# Patient Record
Sex: Female | Born: 2000 | Race: White | Hispanic: No | Marital: Married
Health system: Southern US, Community
[De-identification: ages and names within clinical notes are randomized; demographics above are authoritative.]

## PROBLEM LIST (undated history)

## (undated) DIAGNOSIS — B019 Varicella without complication: Secondary | ICD-10-CM

## (undated) DIAGNOSIS — K219 Gastro-esophageal reflux disease without esophagitis: Secondary | ICD-10-CM

## (undated) DIAGNOSIS — R109 Unspecified abdominal pain: Secondary | ICD-10-CM

## (undated) DIAGNOSIS — R51 Headache: Secondary | ICD-10-CM

## (undated) DIAGNOSIS — R519 Headache, unspecified: Secondary | ICD-10-CM

## (undated) DIAGNOSIS — K529 Noninfective gastroenteritis and colitis, unspecified: Secondary | ICD-10-CM

## (undated) HISTORY — PX: WISDOM TOOTH EXTRACTION: SHX21

---
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
## 2019-01-19 ENCOUNTER — Other Ambulatory Visit: Payer: Self-pay | Admitting: Family Medicine

## 2019-01-19 DIAGNOSIS — Z3041 Encounter for surveillance of contraceptive pills: Secondary | ICD-10-CM

## 2019-03-15 ENCOUNTER — Ambulatory Visit: Payer: BLUE CROSS/BLUE SHIELD

## 2019-04-16 ENCOUNTER — Other Ambulatory Visit: Payer: Self-pay | Admitting: Family Medicine

## 2019-04-16 ENCOUNTER — Other Ambulatory Visit: Payer: Self-pay

## 2019-04-16 ENCOUNTER — Encounter: Payer: Self-pay | Admitting: Family Medicine

## 2019-04-16 ENCOUNTER — Ambulatory Visit (INDEPENDENT_AMBULATORY_CARE_PROVIDER_SITE_OTHER): Payer: BLUE CROSS/BLUE SHIELD | Admitting: Family Medicine

## 2019-04-16 VITALS — BP 130/78 | HR 105 | Temp 99.4°F | Ht 65.0 in | Wt 160.0 lb

## 2019-04-16 DIAGNOSIS — Z3041 Encounter for surveillance of contraceptive pills: Secondary | ICD-10-CM

## 2019-04-16 DIAGNOSIS — Z23 Encounter for immunization: Secondary | ICD-10-CM

## 2019-04-16 MED ORDER — BLISOVI 24 FE 1-20 MG-MCG(24) PO TABS: 1.0000 | ORAL_TABLET | Freq: Every day | ORAL | 4 refills | Status: DC

## 2019-04-16 NOTE — Addendum Note (Signed)
Addended byCarrolyn Leigh on: 04/16/2019 03:09 PM   Modules accepted: Orders

## 2019-04-16 NOTE — Progress Notes (Signed)
Subjective: CC: Contraception PCP: Raliegh Ip, DO Patricia Hurley is a 18 y.o. female presenting to clinic today for:  1. Contraceptive counseling Patient presents today for follow up on contraception.  She is a G0P0.  She reports her menstrual cycles are regular and light.  She is compliant with OCP. Previous methods of birth control tried: OCPs.  She is sexually active with 1 partner.  She denies h/o STIs.  She denies heavy menstrual bleeding, excessive cramping, abdominal masses. No abnormal vaginal discharge, lesions.  She denies personal or family history of: liver disease, breast cancer, clotting disorder (including DVT/PE), migraine headaches. She is a non smoker. Patient's last menstrual period was 03/28/2019.  Last pap: n/a Last unprotected sex: never.     ROS: Per HPI  No Known Allergies Past Medical History:  Diagnosis Date  . Abdominal pain   . Chronic diarrhea   . GERD (gastroesophageal reflux disease)    with chronic belching  . Headache   . Varicella    PMH: x 2    Current Outpatient Medications:  .  amitriptyline (ELAVIL) 10 MG tablet, Take 20 mg by mouth at bedtime. , Disp: , Rfl: 5 .  Norethindrone Acetate-Ethinyl Estrad-FE (BLISOVI 24 FE) 1-20 MG-MCG(24) tablet, Take 1 tablet by mouth daily. (PLease make you yearly Nov appt), Disp: 84 tablet, Rfl: 0 .  ondansetron (ZOFRAN ODT) 4 MG disintegrating tablet, Take 1 tablet (4 mg total) by mouth every 8 (eight) hours as needed for nausea or vomiting., Disp: 12 tablet, Rfl: 0 .  pantoprazole (PROTONIX) 40 MG tablet, Take 40 mg by mouth daily., Disp: , Rfl: 2 Social History   Socioeconomic History  . Marital status: Single    Spouse name: Not on file  . Number of children: Not on file  . Years of education: Not on file  . Highest education level: Not on file  Occupational History  . Not on file  Tobacco Use  . Smoking status: Passive Smoke Exposure - Never Smoker  . Smokeless tobacco: Never Used    . Tobacco comment: father smokes in the home  Substance and Sexual Activity  . Alcohol use: Yes    Comment: social  . Drug use: No    Comment: ask pt in private  . Sexual activity: Not on file    Comment: ask pt in private; pt on birth control  Other Topics Concern  . Not on file  Social History Narrative   Pt in 11th grade 2018-19 and does excellent in school. Lives at home with mother and brother with alternating weeks with father.   Social Determinants of Health   Financial Resource Strain:   . Difficulty of Paying Living Expenses: Not on file  Food Insecurity:   . Worried About Programme researcher, broadcasting/film/video in the Last Year: Not on file  . Ran Out of Food in the Last Year: Not on file  Transportation Needs:   . Lack of Transportation (Medical): Not on file  . Lack of Transportation (Non-Medical): Not on file  Physical Activity:   . Days of Exercise per Week: Not on file  . Minutes of Exercise per Session: Not on file  Stress:   . Feeling of Stress : Not on file  Social Connections:   . Frequency of Communication with Friends and Family: Not on file  . Frequency of Social Gatherings with Friends and Family: Not on file  . Attends Religious Services: Not on file  . Active Member  of Clubs or Organizations: Not on file  . Attends Archivist Meetings: Not on file  . Marital Status: Not on file  Intimate Partner Violence:   . Fear of Current or Ex-Partner: Not on file  . Emotionally Abused: Not on file  . Physically Abused: Not on file  . Sexually Abused: Not on file   Family History  Problem Relation Age of Onset  . Hypertension Mother   . Depression Father   . Scoliosis Sister   . ADD / ADHD Brother     Objective: Office vital signs reviewed. BP 130/78   Pulse (!) 105   Temp 99.4 F (37.4 C) (Temporal)   Ht 5\' 5"  (1.651 m)   Wt 160 lb (72.6 kg)   LMP 03/28/2019   BMI 26.63 kg/m   Physical Examination:  General: Awake, alert, well nourished, No acute  distress HEENT: Normal, sclera white, MMM Cardio: regular rate and rhythm, S1S2 heard, no murmurs appreciated Pulm: clear to auscultation bilaterally, no wheezes, rhonchi or rales; normal work of breathing on room air GI: soft, non-tender, non-distended, no hepatomegaly, no splenomegaly, no masses GU: no palpable adnexal or uterine masses  Assessment/ Plan: 18 y.o. female   1. Encounter for surveillance of contraceptive pills Stable.  Continue OCP.  Follow-up care or in 1 year. - Norethindrone Acetate-Ethinyl Estrad-FE (BLISOVI 24 FE) 1-20 MG-MCG(24) tablet; Take 1 tablet by mouth daily.  Dispense: 84 tablet; Refill: [facility], [place] [phone]

## 2019-05-10 ENCOUNTER — Ambulatory Visit (INDEPENDENT_AMBULATORY_CARE_PROVIDER_SITE_OTHER): Payer: BLUE CROSS/BLUE SHIELD | Admitting: *Deleted

## 2019-05-10 ENCOUNTER — Telehealth: Payer: Self-pay | Admitting: Family Medicine

## 2019-05-10 DIAGNOSIS — Z111 Encounter for screening for respiratory tuberculosis: Secondary | ICD-10-CM | POA: Diagnosis not present

## 2019-05-10 NOTE — Progress Notes (Signed)
PPd skin test placed on right forearm. Patient tolerated well. Patient was given instructions on when to return to have ppd skin test read.

## 2019-05-10 NOTE — Telephone Encounter (Signed)
Wants to speak with nurse regarding immunization records.

## 2019-05-10 NOTE — Telephone Encounter (Signed)
Appt made

## 2019-05-12 LAB — TB SKIN TEST
Induration: 0 mm
TB Skin Test: NEGATIVE

## 2020-06-03 ENCOUNTER — Other Ambulatory Visit: Payer: Self-pay | Admitting: Family Medicine

## 2020-06-03 DIAGNOSIS — Z3041 Encounter for surveillance of contraceptive pills: Secondary | ICD-10-CM

## 2020-06-06 ENCOUNTER — Other Ambulatory Visit: Payer: Self-pay | Admitting: Family Medicine

## 2020-06-06 DIAGNOSIS — Z3041 Encounter for surveillance of contraceptive pills: Secondary | ICD-10-CM

## 2020-06-13 ENCOUNTER — Other Ambulatory Visit: Payer: Self-pay | Admitting: Family Medicine

## 2020-06-13 DIAGNOSIS — Z3041 Encounter for surveillance of contraceptive pills: Secondary | ICD-10-CM

## 2020-07-19 ENCOUNTER — Ambulatory Visit: Payer: BLUE CROSS/BLUE SHIELD | Admitting: Family Medicine

## 2020-08-03 ENCOUNTER — Encounter: Payer: Self-pay | Admitting: Family Medicine

## 2020-08-03 ENCOUNTER — Other Ambulatory Visit: Payer: Self-pay

## 2020-08-03 ENCOUNTER — Ambulatory Visit: Payer: Managed Care, Other (non HMO) | Admitting: Family Medicine

## 2020-08-03 VITALS — BP 111/73 | HR 92 | Temp 97.4°F | Ht 65.0 in | Wt 164.0 lb

## 2020-08-03 DIAGNOSIS — Z1159 Encounter for screening for other viral diseases: Secondary | ICD-10-CM

## 2020-08-03 DIAGNOSIS — L819 Disorder of pigmentation, unspecified: Secondary | ICD-10-CM

## 2020-08-03 DIAGNOSIS — K582 Mixed irritable bowel syndrome: Secondary | ICD-10-CM

## 2020-08-03 DIAGNOSIS — Z130 Encounter for screening for diseases of the blood and blood-forming organs and certain disorders involving the immune mechanism: Secondary | ICD-10-CM

## 2020-08-03 DIAGNOSIS — Z3041 Encounter for surveillance of contraceptive pills: Secondary | ICD-10-CM

## 2020-08-03 DIAGNOSIS — Z111 Encounter for screening for respiratory tuberculosis: Secondary | ICD-10-CM | POA: Diagnosis not present

## 2020-08-03 DIAGNOSIS — Z114 Encounter for screening for human immunodeficiency virus [HIV]: Secondary | ICD-10-CM

## 2020-08-03 DIAGNOSIS — Z0001 Encounter for general adult medical examination with abnormal findings: Secondary | ICD-10-CM

## 2020-08-03 DIAGNOSIS — Z0000 Encounter for general adult medical examination without abnormal findings: Secondary | ICD-10-CM

## 2020-08-03 LAB — CBC WITH DIFFERENTIAL/PLATELET
Basophils Absolute: 0 10*3/uL (ref 0.0–0.2)
Basos: 1 %
EOS (ABSOLUTE): 0 10*3/uL (ref 0.0–0.4)
Eos: 1 %
Hematocrit: 41.2 % (ref 34.0–46.6)
Hemoglobin: 14 g/dL (ref 11.1–15.9)
Immature Grans (Abs): 0 10*3/uL (ref 0.0–0.1)
Immature Granulocytes: 0 %
Lymphocytes Absolute: 2.1 10*3/uL (ref 0.7–3.1)
Lymphs: 36 %
MCH: 31.6 pg (ref 26.6–33.0)
MCHC: 34 g/dL (ref 31.5–35.7)
MCV: 93 fL (ref 79–97)
Monocytes Absolute: 0.3 10*3/uL (ref 0.1–0.9)
Monocytes: 6 %
Neutrophils Absolute: 3.4 10*3/uL (ref 1.4–7.0)
Neutrophils: 56 %
Platelets: 385 10*3/uL (ref 150–450)
RBC: 4.43 x10E6/uL (ref 3.77–5.28)
RDW: 11.8 % (ref 11.7–15.4)
WBC: 6 10*3/uL (ref 3.4–10.8)

## 2020-08-03 MED ORDER — BLISOVI 24 FE 1-20 MG-MCG(24) PO TABS: 1.0000 | ORAL_TABLET | Freq: Every day | ORAL | 4 refills | Status: DC

## 2020-08-03 NOTE — Progress Notes (Signed)
Patricia Hurley is a 20 y.o. female presents to office today for annual physical exam examination.    Concerns today include: 1. School forms>[employer] for nursing. Plans for NICU/ OB/GYN.  Will need TB testing. No known exposures to HIV/ Hep c. No needles sticks at work. No barriers to performing tasks  2. IBS Stable with meds provided by GI. Flare ups less than 4 times per month  3. OCP Monogamous relationship.  Compliant with OCP. No vaginal symptoms. Menses are regular.  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 DTaP 06/05/2000, 07/31/2000, 10/13/2000, 06/26/2001  . HPV 9-valent 07/10/2015, 11/13/2015  . HPV Quadrivalent 04/28/2015  . Hepatitis B 2000/03/07, 04/25/2000, 10/13/2000  . HiB (PRP-OMP) 06/05/2000, 07/31/2000, 10/13/2000, 03/27/2001  . IPV 06/05/2000, 07/31/2000, 06/26/2001, 05/14/2005  . Influenza, Seasonal, Injecte, Preservative Fre 03/06/2020  . Influenza,inj,Quad PF,6+ Mos 05/26/2018  . Influenza-Unspecified 03/13/2019  . MMR 03/27/2001, 04/03/2006  . PFIZER(Purple Top)SARS-COV-2 Vaccination 01/18/2020, 02/10/2020  . PPD Test 05/26/2018, 10/20/2018, 05/10/2019  . Pneumococcal Conjugate-13 10/13/2000, 06/26/2001, 07/31/2001, 03/25/2002  . Tdap 12/22/2001, 04/16/2019  . Varicella 03/27/2001, 05/28/2018     Past Medical History:  Diagnosis Date  . Abdominal pain   . Chronic diarrhea   . GERD (gastroesophageal reflux disease)    with chronic belching  . Headache   . Varicella    PMH: x 2   Social History   Socioeconomic History  . Marital status: Single    Spouse name: Not on file  . Number of children: Not on file  . Years of education: Not on file  . Highest education level: Not on file  Occupational History  . Not on file   Tobacco Use  . Smoking status: Passive Smoke Exposure - Never Smoker  . Smokeless tobacco: Never Used  . Tobacco comment: father smokes in the home  Vaping Use  . Vaping Use: Never used  Substance and Sexual Activity  . Alcohol use: Yes    Comment: social  . Drug use: No    Comment: ask pt in private  . Sexual activity: Not on file    Comment: ask pt in private; pt on birth control  Other Topics Concern  . Not on file  Social History Narrative   Pt in 11th grade 2018-19 and does excellent in school. Lives at home with mother and brother with alternating weeks with father.   Social Determinants of Health   Financial Resource Strain: Not on file  Food Insecurity: Not on file  Transportation Needs: Not on file  Physical Activity: Not on file  Stress: Not on file  Social Connections: Not on file  Intimate Partner Violence: Not on file   Past Surgical History:  Procedure Laterality Date  . COLONOSCOPY WITH PROPOFOL N/A 12/17/2016   Procedure: COLONOSCOPY WITH PROPOFOL;  Surgeon: Joycelyn Rua, MD;  Location: [place];  Service: Gastroenterology;  Laterality: N/A;  . ESOPHAGOGASTRODUODENOSCOPY (EGD) WITH PROPOFOL N/A 12/17/2016   Procedure: ESOPHAGOGASTRODUODENOSCOPY (EGD) WITH PROPOFOL;  Surgeon: Joycelyn Rua, MD;  Location: [place];  Service: Gastroenterology;  Laterality: N/A;  . WISDOM TOOTH EXTRACTION     Family History  Problem Relation Age of Onset  . Hypertension Mother   . Depression Father   . Scoliosis Sister   . ADD / ADHD Brother  Current Outpatient Medications:  .  amitriptyline (ELAVIL) 10 MG tablet, Take 20 mg by mouth at bedtime. , Disp: , Rfl: 5 .  hyoscyamine (LEVSIN) 0.125 MG tablet, Take by mouth., Disp: , Rfl:  .  ondansetron (ZOFRAN ODT) 4 MG disintegrating tablet, Take 1 tablet (4 mg total) by mouth every 8 (eight) hours as needed for nausea or vomiting., Disp: 12 tablet, Rfl: 0 .  pantoprazole (PROTONIX) 40 MG tablet, Take 40 mg by mouth  daily., Disp: , Rfl: 2 .  Norethindrone Acetate-Ethinyl Estrad-FE (BLISOVI 24 FE) 1-20 MG-MCG(24) tablet, Take 1 tablet by mouth daily., Disp: 84 tablet, Rfl: 4  No Known Allergies   ROS: Review of Systems Pertinent items noted in HPI and remainder of comprehensive ROS otherwise negative.    Physical exam BP 111/73   Pulse 92   Temp (!) 97.4 F (36.3 C) (Temporal)   Ht 5' 5"  (1.651 m)   Wt 164 lb (74.4 kg)   LMP 07/20/2020   SpO2 97%   BMI 27.29 kg/m  General appearance: alert, cooperative, appears stated age and no distress Head: Normocephalic, without obvious abnormality, atraumatic Eyes: negative findings: lids and lashes normal, conjunctivae and sclerae normal, corneas clear and pupils equal, round, reactive to light and accomodation Ears: normal TM's and external ear canals both ears Nose: Nares normal. Septum midline. Mucosa normal. No drainage or sinus tenderness. Throat: lips, mucosa, and tongue normal; teeth and gums normal Neck: no adenopathy, supple, symmetrical, trachea midline and thyroid not enlarged, symmetric, no tenderness/mass/nodules Back: symmetric, no curvature. ROM normal. No CVA tenderness. Lungs: clear to auscultation bilaterally Heart: regular rate and rhythm, S1, S2 normal, no murmur, click, rub or gallop Abdomen: soft, non-tender; bowel sounds normal; no masses,  no organomegaly Extremities: extremities normal, atraumatic, no cyanosis or edema Pulses: 2+ and symmetric Skin: 1 mm pigmented nevi noted on right anterior shin, flat. nonbleeding Lymph nodes: Cervical, supraclavicular, and axillary nodes normal. Neurologic: Alert and oriented X 3, normal strength and tone. Normal symmetric reflexes. Normal coordination and gait Psych: mood stable Depression screen [facility] 2/9 08/03/2020 04/16/2019 03/13/2018  Decreased Interest 0 0 0  Down, Depressed, Hopeless 0 0 0  PHQ - 2 Score 0 0 0  Altered sleeping - 0 0  Tired, decreased energy - 0 0  Change in  appetite - 0 0  Feeling bad or failure about yourself  - 0 0  Trouble concentrating - 0 0  Moving slowly or fidgety/restless - 0 0  Suicidal thoughts - 0 0  PHQ-9 Score - 0 0  Difficult doing work/chores - - Not difficult at all     Assessment/ Plan: [name] here for annual physical exam.   Annual physical exam  Encounter for surveillance of contraceptive pills - Plan: CMP14+EGFR, Norethindrone Acetate-Ethinyl Estrad-FE (BLISOVI 24 FE) 1-20 MG-MCG(24) tablet, CMP14+EGFR  Screening for tuberculosis - Plan: QuantiFERON-TB Gold Plus, CMP14+EGFR, CMP14+EGFR  Encounter for hepatitis C screening test for low risk patient - Plan: Hepatitis C antibody, CMP14+EGFR, CMP14+EGFR  Screening for HIV without presence of risk factors - Plan: HIV antibody (with reflex), CMP14+EGFR, CMP14+EGFR  Screening, anemia, deficiency, iron - Plan: CBC with Differential  Pigmented skin lesion  Irritable bowel syndrome with both constipation and diarrhea  OCPs renewed.  Check CMP, CBC.  Last CBC showed elevated white blood cell count but this was sometime ago.  Overdue for first Pap smear after 03/26/2021  School form completed today.  Check quant gold, hepatitis C screen, HIV screen.  Pigmented skin lesion appears benign but since this is new I have recommend eval by dermatology.  She would like to watchfully wait.  She will contact me if things change.  IBS is stable and unchanged.  Not interfering with ability to complete work tasks or impeding quality of life.  Continue current medication regimen as prescribed by gastroenterology  Counseled on healthy lifestyle choices, including diet (rich in fruits, vegetables and lean meats and low in salt and simple carbohydrates) and exercise (at least 30 minutes of moderate physical activity daily).  Patient to follow up in 1 year for annual exam or sooner if needed.  Ethlyn Alto M. Lajuana Ripple, DO

## 2020-08-03 NOTE — Patient Instructions (Signed)

## 2020-08-04 LAB — CMP14+EGFR
ALT: 7 IU/L (ref 0–32)
AST: 10 IU/L (ref 0–40)
Albumin/Globulin Ratio: 1.7 (ref 1.2–2.2)
Albumin: 4.3 g/dL (ref 3.9–5.0)
Alkaline Phosphatase: 52 IU/L (ref 42–106)
BUN/Creatinine Ratio: 11 (ref 9–23)
BUN: 9 mg/dL (ref 6–20)
Bilirubin Total: 0.3 mg/dL (ref 0.0–1.2)
CO2: 21 mmol/L (ref 20–29)
Calcium: 9.7 mg/dL (ref 8.7–10.2)
Chloride: 103 mmol/L (ref 96–106)
Creatinine, Ser: 0.8 mg/dL (ref 0.57–1.00)
Globulin, Total: 2.6 g/dL (ref 1.5–4.5)
Glucose: 113 mg/dL — ABNORMAL HIGH (ref 65–99)
Potassium: 4.2 mmol/L (ref 3.5–5.2)
Sodium: 141 mmol/L (ref 134–144)
Total Protein: 6.9 g/dL (ref 6.0–8.5)
eGFR: 108 mL/min/{1.73_m2} (ref >59)

## 2020-08-04 LAB — QUANTIFERON-TB GOLD PLUS

## 2020-08-06 LAB — QUANTIFERON-TB GOLD PLUS
QuantiFERON Mitogen Value: >10 IU/mL
QuantiFERON TB1 Ag Value: 0.01 IU/mL
QuantiFERON TB2 Ag Value: 0.01 IU/mL
QuantiFERON-TB Gold Plus: NEGATIVE

## 2020-08-06 LAB — HIV ANTIBODY (ROUTINE TESTING W REFLEX): HIV Screen 4th Generation wRfx: NONREACTIVE

## 2020-08-06 LAB — HEPATITIS C ANTIBODY: Hep C Virus Ab: <0.1 s/co ratio (ref 0.0–0.9)

## 2021-10-08 ENCOUNTER — Other Ambulatory Visit: Payer: Self-pay | Admitting: Family Medicine

## 2021-10-08 DIAGNOSIS — Z3041 Encounter for surveillance of contraceptive pills: Secondary | ICD-10-CM

## 2021-10-25 ENCOUNTER — Encounter: Payer: Self-pay | Admitting: Family Medicine

## 2021-10-26 ENCOUNTER — Other Ambulatory Visit: Payer: Self-pay

## 2021-10-26 DIAGNOSIS — Z3041 Encounter for surveillance of contraceptive pills: Secondary | ICD-10-CM

## 2021-10-26 MED ORDER — BLISOVI 24 FE 1-20 MG-MCG(24) PO TABS: 1.0000 | ORAL_TABLET | Freq: Every day | ORAL | 0 refills | Status: DC

## 2021-12-04 ENCOUNTER — Encounter: Payer: Self-pay | Admitting: Family Medicine

## 2021-12-04 ENCOUNTER — Ambulatory Visit: Payer: Managed Care, Other (non HMO) | Admitting: Family Medicine

## 2021-12-04 VITALS — BP 142/94 | HR 111 | Temp 98.0°F | Ht 65.0 in | Wt 168.0 lb

## 2021-12-04 DIAGNOSIS — F411 Generalized anxiety disorder: Secondary | ICD-10-CM | POA: Diagnosis not present

## 2021-12-04 DIAGNOSIS — Z3041 Encounter for surveillance of contraceptive pills: Secondary | ICD-10-CM | POA: Diagnosis not present

## 2021-12-04 DIAGNOSIS — Z111 Encounter for screening for respiratory tuberculosis: Secondary | ICD-10-CM

## 2021-12-04 DIAGNOSIS — Z124 Encounter for screening for malignant neoplasm of cervix: Secondary | ICD-10-CM

## 2021-12-04 MED ORDER — BLISOVI 24 FE 1-20 MG-MCG(24) PO TABS: 1.0000 | ORAL_TABLET | Freq: Every day | ORAL | 4 refills | Status: DC

## 2021-12-04 MED ORDER — BUSPIRONE HCL 5 MG PO TABS: ORAL_TABLET | ORAL | 0 refills | Status: AC

## 2021-12-04 NOTE — Patient Instructions (Signed)
How to Take Your Blood Pressure Blood pressure measures how strongly your blood is pressing against the walls of your arteries. Arteries are blood vessels that carry blood from your heart throughout your body. You can take your blood pressure at home with a machine. You may need to check your blood pressure at home: To check if you have high blood pressure (hypertension). To check your blood pressure over time. To make sure your blood pressure medicine is working. Supplies needed: Blood pressure machine, or monitor. A chair to sit in. This should be a chair where you can sit upright with your back supported. Do not sit on a soft couch or an armchair. Table or desk. Small notebook. Pencil or pen. How to prepare Avoid these things for 30 minutes before checking your blood pressure: Having drinks with caffeine in them, such as coffee or tea. Drinking alcohol. Eating. Smoking. Exercising. Do these things five minutes before checking your blood pressure: Go to the bathroom and pee (urinate). Sit in a chair. Be quiet. Do not talk. How to take your blood pressure Follow the instructions that came with your machine. If you have a digital blood pressure monitor, these may be the instructions: Sit up straight. Place your feet on the floor. Do not cross your ankles or legs. Rest your left arm at the level of your heart. You may rest it on a table, desk, or chair. Pull up your shirt sleeve. Wrap the blood pressure cuff around the upper part of your left arm. The cuff should be 1 inch (2.5 cm) above your elbow. It is best to wrap the cuff around bare skin. Fit the cuff snugly around your arm, but not too tightly. You should be able to place only one finger between the cuff and your arm. Place the cord so that it rests in the bend of your elbow. Press the power button. Sit quietly while the cuff fills with air and loses air. Write down the numbers on the screen. Wait 2-3 minutes and then repeat  steps 1-10. What do the numbers mean? Two numbers make up your blood pressure. The first number is called systolic pressure. The second is called diastolic pressure. An example of a blood pressure reading is "120 over 80" (or 120/80). If you are an adult and do not have a medical condition, use this guide to find out if your blood pressure is normal: Normal First number: below 120. Second number: below 80. Elevated First number: 120-129. Second number: below 80. Hypertension stage 1 First number: 130-139. Second number: 80-89. Hypertension stage 2 First number: 140 or above. Second number: 90 or above. Your blood pressure is above normal even if only the first or only the second number is above normal. Follow these instructions at home: Medicines Take over-the-counter and prescription medicines only as told by your doctor. Tell your doctor if your medicine is causing side effects. General instructions Check your blood pressure as often as your doctor tells you to. Check your blood pressure at the same time every day. Take your monitor to your next doctor's appointment. Your doctor will: Make sure you are using it correctly. Make sure it is working right. Understand what your blood pressure numbers should be. Keep all follow-up visits. General tips You will need a blood pressure machine or monitor. Your doctor can suggest a monitor. You can buy one at a drugstore or online. When choosing one: Choose one with an arm cuff. Choose one that wraps around your   upper arm. Only one finger should fit between your arm and the cuff. Do not choose one that measures your blood pressure from your wrist or finger. Where to find more information American Heart Association: www.heart.org Contact a doctor if: Your blood pressure keeps being high. Your blood pressure is suddenly low. Get help right away if: Your first blood pressure number is higher than 180. Your second blood pressure number is  higher than 120. These symptoms may be an emergency. Do not wait to see if the symptoms will go away. Get help right away. Call 911. Summary Check your blood pressure at the same time every day. Avoid caffeine, alcohol, smoking, and exercise for 30 minutes before checking your blood pressure. Make sure you understand what your blood pressure numbers should be. This information is not intended to replace advice given to you by your health care provider. Make sure you discuss any questions you have with your health care provider. Document Revised: 01/04/2021 Document Reviewed: 01/04/2021 Elsevier Patient Education  2023 [employer]  
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
## 2021-12-05 ENCOUNTER — Telehealth: Payer: Self-pay | Admitting: Family Medicine

## 2021-12-05 NOTE — Telephone Encounter (Signed)
Pt schreduled

## 2021-12-06 LAB — QUANTIFERON-TB GOLD PLUS
QuantiFERON Mitogen Value: >10 IU/mL
QuantiFERON Nil Value: 0.18 IU/mL
QuantiFERON TB1 Ag Value: 0.01 IU/mL
QuantiFERON TB2 Ag Value: 0 IU/mL
QuantiFERON-TB Gold Plus: NEGATIVE

## 2021-12-06 LAB — CMP14+EGFR
ALT: 12 IU/L (ref 0–32)
AST: 15 IU/L (ref 0–40)
Albumin/Globulin Ratio: 1.6 (ref 1.2–2.2)
Albumin: 4.6 g/dL (ref 4.0–5.0)
Alkaline Phosphatase: 53 IU/L (ref 44–121)
BUN/Creatinine Ratio: 10 (ref 9–23)
BUN: 9 mg/dL (ref 6–20)
Bilirubin Total: 0.2 mg/dL (ref 0.0–1.2)
CO2: 24 mmol/L (ref 20–29)
Calcium: 10.1 mg/dL (ref 8.7–10.2)
Chloride: 102 mmol/L (ref 96–106)
Creatinine, Ser: 0.89 mg/dL (ref 0.57–1.00)
Globulin, Total: 2.8 g/dL (ref 1.5–4.5)
Glucose: 120 mg/dL — ABNORMAL HIGH (ref 70–99)
Potassium: 4.5 mmol/L (ref 3.5–5.2)
Sodium: 142 mmol/L (ref 134–144)
Total Protein: 7.4 g/dL (ref 6.0–8.5)
eGFR: 95 mL/min/{1.73_m2} (ref >59)

## 2021-12-06 LAB — TSH: TSH: 0.706 u[IU]/mL (ref 0.450–4.500)

## 2021-12-27 ENCOUNTER — Other Ambulatory Visit: Payer: Self-pay | Admitting: Family Medicine

## 2021-12-27 DIAGNOSIS — F411 Generalized anxiety disorder: Secondary | ICD-10-CM

## 2022-01-04 ENCOUNTER — Telehealth (INDEPENDENT_AMBULATORY_CARE_PROVIDER_SITE_OTHER): Payer: Managed Care, Other (non HMO) | Admitting: Family Medicine

## 2022-01-04 DIAGNOSIS — F411 Generalized anxiety disorder: Secondary | ICD-10-CM | POA: Diagnosis not present

## 2022-01-04 MED ORDER — BUSPIRONE HCL 15 MG PO TABS: 15.0000 mg | ORAL_TABLET | Freq: Two times a day (BID) | ORAL | 3 refills | Status: DC

## 2022-01-04 NOTE — Progress Notes (Signed)
MyChart Video visit  Subjective: IR:WERXVQM PCP: Raliegh Ip, DO GQQ:PYPPJK D Hannibal is a 21 y.o. female. Patient provides verbal consent for consult held via video.  Due to COVID-19 pandemic this visit was conducted virtually. This visit type was conducted due to national recommendations for restrictions regarding the COVID-19 Pandemic (e.g. social distancing, sheltering in place) in an effort to limit this patient's exposure and mitigate transmission in our community. All issues noted in this document were discussed and addressed.  A physical exam was not performed with this format.   Location of patient: School Location of provider: [facility] Others present for call: none  1.  Generalized anxiety disorder Patient was started on BuSpar last visit.  She is up to 10 mg twice daily and notes that this is helpful but it tends to wane midday.  She would like to try and avoid the midday dose so is willing to go up on the medication to see if that will help.  Overall she does feel like this is working well for her.  Continues to take the amitriptyline at bedtime as directed by her GI specialist.  Has an appointment scheduled with OB/GYN at the end of the month   ROS: Per HPI  No Known Allergies Past Medical History:  Diagnosis Date   Abdominal pain    Chronic diarrhea    GERD (gastroesophageal reflux disease)    with chronic belching   Headache    Varicella    PMH: x 2    Current Outpatient Medications:    amitriptyline (ELAVIL) 10 MG tablet, Take 20 mg by mouth at bedtime. , Disp: , Rfl: 5   hyoscyamine (LEVSIN) 0.125 MG tablet, Take by mouth., Disp: , Rfl:    Norethindrone Acetate-Ethinyl Estrad-FE (BLISOVI 24 FE) 1-20 MG-MCG(24) tablet, Take 1 tablet by mouth daily., Disp: 84 tablet, Rfl: 4   pantoprazole (PROTONIX) 40 MG tablet, Take 40 mg by mouth daily., Disp: , Rfl: 2  Gen: well appearing female. NAD Psych: Mood stable, speech normal, affect appropriate.  Good eye  contact.  Assessment/ Plan: 21 y.o. female   Generalized anxiety disorder - Plan: busPIRone (BUSPAR) 15 MG tablet  Advance BuSpar to 15 mg twice daily.  New Rx sent.  She will contact me next week if this is helpful.  If not and she does need the midday dose, we can certainly reduce back down to 10 mg and make it 3 times daily for her.  Okay to message via MyChart.  No appointment will be necessary to make this adjustment  Start time: 12:40pm End time: 12:46o  Total time spent on patient care (including video visit/ documentation): 6 minutes  Gaila Engebretsen Hulen Skains, DO [facility] [phone]

## 2022-03-24 ENCOUNTER — Encounter: Payer: Self-pay | Admitting: Family Medicine

## 2022-03-24 DIAGNOSIS — F411 Generalized anxiety disorder: Secondary | ICD-10-CM

## 2022-04-12 ENCOUNTER — Ambulatory Visit (INDEPENDENT_AMBULATORY_CARE_PROVIDER_SITE_OTHER): Payer: 59 | Admitting: Psychiatry

## 2022-04-12 ENCOUNTER — Telehealth: Payer: Self-pay

## 2022-04-12 ENCOUNTER — Encounter (HOSPITAL_COMMUNITY): Payer: Self-pay | Admitting: Psychiatry

## 2022-04-12 DIAGNOSIS — L659 Nonscarring hair loss, unspecified: Secondary | ICD-10-CM | POA: Diagnosis not present

## 2022-04-12 DIAGNOSIS — F410 Panic disorder [episodic paroxysmal anxiety] without agoraphobia: Secondary | ICD-10-CM

## 2022-04-12 DIAGNOSIS — K582 Mixed irritable bowel syndrome: Secondary | ICD-10-CM

## 2022-04-12 DIAGNOSIS — F411 Generalized anxiety disorder: Secondary | ICD-10-CM | POA: Diagnosis not present

## 2022-04-12 MED ORDER — SERTRALINE HCL 25 MG PO TABS: 25.0000 mg | ORAL_TABLET | Freq: Every day | ORAL | 1 refills | Status: DC

## 2022-04-12 MED ORDER — AMITRIPTYLINE HCL 10 MG PO TABS: 10.0000 mg | ORAL_TABLET | Freq: Every day | ORAL | 0 refills | Status: DC

## 2022-04-12 NOTE — Patient Instructions (Addendum)
For the next week, continue your current dose of elavil. Next week (04/19/22) start the 10mg  dose of elavil and take that for two weeks. When we meet next, you should be off of the elavil and we will switch to zoloft. Don't take these medications at the same time. This is the website I was talking about: 

## 2022-04-12 NOTE — Telephone Encounter (Signed)
-----   Message from Sonny Masters, FNP sent at 04/12/2022 11:46 AM EST ----- Can order Vit D and let pt know please   ----- Message ----- From: Elsie Lincoln, MD Sent: 04/12/2022  10:59 AM EST To: Raliegh Ip, DO  Dr. Nadine Counts,  Thank you for the referral. I will taper her off of the elavil and switch to zoloft in the future. Can you check a vitamin d level in the meantime? She has been having some hair loss.   Thanks, Sam

## 2022-04-12 NOTE — Telephone Encounter (Signed)
Patient is aware of labs and will come by to get done

## 2022-04-12 NOTE — Progress Notes (Signed)
Psychiatric Initial Adult Assessment  Patient Identification: Patricia Hurley MRN:  [id number] Date of Evaluation:  04/12/2022 Referral Source: PCP  Assessment:  Patricia Hurley is a 21 y.o. female with a history of GAD with panic attacks, IBS, and hair loss who presents to [facility] via video conferencing for initial evaluation of anxiety and medication management.  Patient reports mood symptoms consistent with GAD and panic attacks only occur a few times per year. She has noted benefit with buspar use but waning effect at midday. 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 She has been on elavil for IBS for years but has intermittent improvement with this. She was amenable to tapering off of this medication in preparation for adding a different standing medication for mood. In treating her anxiety would expect IBS to improve significantly. Will taper elavil with plan to switch to zoloft and ultimately likely come off buspar in the future. She is having a bit of hair loss so will coordinate with PCP about getting a vitamin d level. She is also potentially family planning in the next year and zoloft would be the safest option for this in the future. Follow up in about 3 weeks.  Plan:  # GAD with panic attacks Past medication trials:  Status of problem: new to provider Interventions: -- plan for zoloft once off elavil (start at 25mg  daily) -- continue buspar 15mg  bid for now  # IBS: constipation/diarrhea Past medication trials:  Status of problem: new to provider Interventions: -- taper elavail to 10mg  nightly x2 weeks after 1 additional week of 25mg  dosing -- continue hyoscyamine 0.125mg  tablet daily prn   # Hair loss Past medication trials:  Status of problem: new to provider Interventions: -- PCP to get vitamin d level  Patient was given contact information for  behavioral health clinic and was instructed to call 911 for emergencies.   Subjective:  Chief Complaint:  Chief Complaint  Patient presents with   Anxiety   Irritable Bowel Syndrome   Establish Care    History of Present Illness:  PCP referred to see if she can change anxiety medication. Is taking elavil at night by her GI doctor for IBS. On for years. Intermittently effective.   Lives with fiance, dog and a cat. Everyone getting along. 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 Enjoys boating in summer months and crotcheting in the winter. Still enjoys. Sleep is ok about 6-7hrs per night. Appetite is fine, 3 meals per day. No binges. No restricting. No purging. Concentration is ok. Fidgety. No guilt feelings. No SI at present. Only time it was was in high school.   Chronic worry across multiple domains. Panic attacks occur 2-3 times per year. No period of sleeplessness. No hallucinations. No paranoia. Alcohol is only social and will be 1-2 glasses of wine. No tobacco products. No other drugs. May be pursuing having children in the next year.    Past Psychiatric History:  Diagnoses: anxiety, IBS Medication trials: amitriptyline, buspar Previous psychiatrist/therapist: therapy in high school Hospitalizations: none Suicide attempts: none SIB: none Hx of violence towards others: none Current access to guns: yes, secured in gunsafe Hx of abuse: none  Previous Psychotropic Medications: Yes   Substance Abuse History in the last 12 months:  No.  Past Medical History:  Past Medical History:  Diagnosis Date   Abdominal  pain    Chronic diarrhea    GERD (gastroesophageal reflux disease)    with chronic belching   Headache    Varicella    PMH: x 2    Past Surgical History:  Procedure Laterality Date   COLONOSCOPY WITH PROPOFOL N/A 12/17/2016   Procedure: COLONOSCOPY WITH PROPOFOL;  Surgeon: Adelene Amas, MD;  Location: [facility] ENDOSCOPY;  Service:  Gastroenterology;  Laterality: N/A;   ESOPHAGOGASTRODUODENOSCOPY (EGD) WITH PROPOFOL N/A 12/17/2016   Procedure: ESOPHAGOGASTRODUODENOSCOPY (EGD) WITH PROPOFOL;  Surgeon: Adelene Amas, MD;  Location: [facility] ENDOSCOPY;  Service: Gastroenterology;  Laterality: N/A;   WISDOM TOOTH EXTRACTION      Family Psychiatric History: per below  Family History:  Family History  Problem Relation Age of Onset   Hypertension Mother    Depression Father    Scoliosis Sister    ADD / ADHD Brother     Social History:   Social History   Socioeconomic History   Marital status: Single    Spouse name: Not on file   Number of children: Not on file   Years of education: Not on file   Highest education level: Not on file  Occupational History   Not on file  Tobacco Use   Smoking status: Passive Smoke Exposure - Never Smoker   Smokeless tobacco: Never   Tobacco comments:    father smokes in the home  Vaping Use   Vaping Use: Never used  Substance and Sexual Activity   Alcohol use: Yes    Comment: social 1-2 glasses of wine   Drug use: No    Comment: ask pt in private   Sexual activity: Not on file    Comment: ask pt in private; pt on birth control  Other Topics Concern   Not on file  Social History Narrative   Pt in 11th grade 2018-19 and does excellent in school. Lives at home with mother and brother with alternating weeks with father.   Social Determinants of Health   Financial Resource Strain: Not on file  Food Insecurity: Not on file  Transportation Needs: Not on file  Physical Activity: Not on file  Stress: Not on file  Social Connections: Not on file    Additional Social History: see HPI  Allergies:  No Known Allergies  Current Medications: Current Outpatient Medications  Medication Sig Dispense Refill   amitriptyline (ELAVIL) 10 MG tablet Take 1 tablet (10 mg total) by mouth at bedtime. 14 tablet 0   [START ON 05/03/2022] sertraline (ZOLOFT) 25 MG tablet Take 1 tablet (25 mg  total) by mouth daily. 30 tablet 1   busPIRone (BUSPAR) 15 MG tablet Take 1 tablet (15 mg total) by mouth 2 (two) times daily. 180 tablet 3   hyoscyamine (LEVSIN) 0.125 MG tablet Take by mouth.     Norethindrone Acetate-Ethinyl Estrad-FE (BLISOVI 24 FE) 1-20 MG-MCG(24) tablet Take 1 tablet by mouth daily. 84 tablet 4   pantoprazole (PROTONIX) 40 MG tablet Take 40 mg by mouth daily.  2   No current facility-administered medications for this visit.    ROS: Review of Systems  Constitutional:  Negative for appetite change and unexpected weight change.  Cardiovascular:  Negative for palpitations.  Gastrointestinal:  Positive for constipation and diarrhea. Negative for nausea and vomiting.  Endocrine: Positive for heat intolerance. Negative for cold intolerance and polyphagia.  Musculoskeletal:  Negative for arthralgias and back pain.  Skin:        Hair loss  Neurological:  Negative for dizziness and  headaches.  Psychiatric/Behavioral:  Negative for decreased concentration, dysphoric mood, hallucinations, self-injury, sleep disturbance and suicidal ideas. The patient is nervous/anxious.     Objective:  Psychiatric Specialty Exam: There were no vitals taken for this visit.There is no height or weight on file to calculate BMI.  General Appearance: Neat, Well Groomed, and in scrubs (work Public relations account executiveattire). Appears stated age  Eye Contact:  Good  Speech:  Clear and Coherent and Normal Rate  Volume:  Normal  Mood:  Anxious  Affect:  Appropriate, Congruent, and Constricted. Anxious  Thought Content: Logical and Hallucinations: None   Suicidal Thoughts:  No  Homicidal Thoughts:  No  Thought Process:  Coherent, Goal Directed, and Linear  Orientation:  Full (Time, Place, and Person)    Memory:  Immediate;   Good Recent;   Good Remote;   Good  Judgment:  Good  Insight:  Good  Concentration:  Concentration: Good and Attention Span: Good  Recall:  Good  Fund of Knowledge: Good  Language: Good   Psychomotor Activity:  Normal  Akathisia:  No  AIMS (if indicated): not done  Assets:  Communication Skills Desire for Improvement Financial Resources/Insurance Housing Intimacy Leisure Time Physical Health Resilience Social Support Talents/Skills Transportation Vocational/Educational  ADL's:  Intact  Cognition: WNL  Sleep:  Good   PE: General: sits comfortably in view of camera; no acute distress  Pulm: no increased work of breathing on room air  MSK: all extremity movements appear intact  Neuro: no focal neurological deficits observed  Gait & Station: unable to assess by video    Metabolic Disorder Labs: No results found for: "HGBA1C", "MPG" No results found for: "PROLACTIN" No results found for: "CHOL", "TRIG", "HDL", "CHOLHDL", "VLDL", "LDLCALC" Lab Results  Component Value Date   TSH 0.706 12/04/2021    Therapeutic Level Labs: No results found for: "LITHIUM" No results found for: "CBMZ" No results found for: "VALPROATE"  Screenings:  PHQ2-9    Flowsheet Row [facility] Office [facility] from [facility]/16/2019 [facility] Visit from 03[facility]13/2018 [facility]/2017 [facility]  PHQ-2 Total Score 1 0 0 0 0  PHQ-9 Total Score -- -- 0 0 0      Flowsheet Row [facility]/12/2022 [facility]  C-SSRS RISK CATEGORY No Risk       Collaboration of Care: Collaboration of Care: Medication Management AEB as above and Primary Care Provider AEB blood work  Patient/Guardian was advised Release of Information must be obtained prior to any record release in order to collaborate their care with an outside provider. Patient/Guardian was advised if they have not already done so to contact the  registration department to sign all necessary forms in order for us to release information regarding their care.   Consent: Patient/Guardian gives verbal consent for treatment and assignment of benefits for services provided during this visit. Patient/Guardian expressed understanding and agreed to proceed.   Televisit via video: I connected with Patricia Hurley on 04/12/22 at 10:00 AM EST by a video enabled telemedicine application and verified that I am speaking with the correct person using two identifiers.  Location: Patient: work at [employer] [place] Provider: home office   I discussed the limitations of evaluation and management by telemedicine and the availability of in person appointments. The patient expressed understanding  and agreed to proceed.  I discussed the assessment and treatment plan with the patient. The patient was provided an opportunity to ask questions and all were answered. The patient agreed with the plan and demonstrated an understanding of the instructions.   The patient was advised to call back or seek an in-person evaluation if the symptoms worsen or if the condition fails to improve as anticipated.  I provided 60 minutes of non-face-to-face time during this encounter.  Elsie Lincoln, MD 12/8/202310:59 AM

## 2022-04-13 LAB — VITAMIN D 25 HYDROXY (VIT D DEFICIENCY, FRACTURES): Vit D, 25-Hydroxy: 42.9 ng/mL (ref 30.0–100.0)

## 2022-04-15 ENCOUNTER — Other Ambulatory Visit (HOSPITAL_COMMUNITY): Payer: Self-pay | Admitting: Psychiatry

## 2022-04-15 DIAGNOSIS — K582 Mixed irritable bowel syndrome: Secondary | ICD-10-CM

## 2022-04-15 DIAGNOSIS — F410 Panic disorder [episodic paroxysmal anxiety] without agoraphobia: Secondary | ICD-10-CM

## 2022-04-23 ENCOUNTER — Other Ambulatory Visit: Payer: Self-pay | Admitting: Family Medicine

## 2022-04-23 DIAGNOSIS — L659 Nonscarring hair loss, unspecified: Secondary | ICD-10-CM

## 2022-04-23 NOTE — Progress Notes (Signed)
Lmtcb.

## 2022-04-23 NOTE — Telephone Encounter (Signed)
This was addressed by Tally Due on 12/8 this has already been addressed and vitamin d was ordered and resulted normal

## 2022-04-23 NOTE — Telephone Encounter (Signed)
-----   Message from Raliegh Ip, DO sent at 04/23/2022 11:55 AM EST ----- Please call the pt to inform of recommended Vit D Level.  Order is in, she just needs to come by to have drawn.   I've cc'd Dr Adrian Blackwater on this as well. ----- Message ----- From: Elsie Lincoln, MD Sent: 04/12/2022  10:59 AM EST To: Raliegh Ip, DO  Dr. Nadine Counts,  Thank you for the referral. I will taper her off of the elavil and switch to zoloft in the future. Can you check a vitamin d level in the meantime? She has been having some hair loss.   Thanks, Sam

## 2022-04-23 NOTE — Addendum Note (Signed)
Addended by: Fara Olden on: 04/23/2022 04:01 PM   Modules accepted: Orders

## 2022-05-03 ENCOUNTER — Telehealth (INDEPENDENT_AMBULATORY_CARE_PROVIDER_SITE_OTHER): Payer: 59 | Admitting: Psychiatry

## 2022-05-03 DIAGNOSIS — F410 Panic disorder [episodic paroxysmal anxiety] without agoraphobia: Secondary | ICD-10-CM | POA: Diagnosis not present

## 2022-05-03 DIAGNOSIS — F411 Generalized anxiety disorder: Secondary | ICD-10-CM | POA: Diagnosis not present

## 2022-05-03 DIAGNOSIS — K582 Mixed irritable bowel syndrome: Secondary | ICD-10-CM

## 2022-05-03 MED ORDER — SERTRALINE HCL 50 MG PO TABS: ORAL_TABLET | ORAL | 1 refills | Status: DC

## 2022-05-03 NOTE — Patient Instructions (Signed)
We discontinued your Elavil at this point.  You can start taking the Zoloft at half a tablet (25 mg) once daily for 1 week.  If you are tolerating this well then you can increase to a full tablet daily thereafter.

## 2022-05-03 NOTE — Progress Notes (Signed)
BH MD Outpatient Progress Note  05/03/2022 11:14 AM Patricia Hurley  MRN:  [id number]  Assessment:  Patricia Hurley presents for follow-up evaluation. Today, 05/03/22, patient reports slight worsening of her anxiety and doing the Elavil taper but no panic attacks.  This was expected though given serotonin discontinuation symptoms and she has not yet started the Zoloft which she will do today.  Vitamin D level came back normal when assessing for hair loss.  Otherwise doing well and will likely make changes to BuSpar once Zoloft at therapeutic level.  Follow up in about 4 weeks.  Identifying Information: Patricia Hurley is a 21 y.o. female with a history of GAD with panic attacks, IBS, and hair loss who is an established patient with [facility] participating in follow-up via video conferencing. Initial evaluation for anxiety and medication management on 04/12/22; please see that note for full case formulation.  Patient reports mood symptoms consistent with GAD and panic attacks only occur a few times per year. She has noted benefit with buspar use but waning effect at midday. 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 She has been on elavil for IBS for years but has intermittent improvement with this. She was amenable to tapering off of this medication in preparation for adding a different standing medication for mood. In treating her anxiety would expect IBS to improve significantly. Tapered elavil with plan to switch to zoloft and ultimately likely come off buspar in the future. She is having a bit of hair loss so coordinated with PCP about getting a vitamin d level. She is also potentially family planning in the next year and zoloft would be the safest option for this in the future.    Plan:   # GAD with panic attacks Past medication trials:  Status of problem: Chronic with mild  exacerbation Interventions: -- Start zoloft 25mg  daily x1 week then increase to 50 mg daily -- continue buspar 15mg  bid for now   # IBS: constipation/diarrhea Past medication trials:  Status of problem: Chronic and stable Interventions: -- Discontinued elavail after taper -- continue hyoscyamine 0.125mg  tablet daily prn    # Hair loss Past medication trials:  Status of problem: Chronic and stable Interventions: -- Vitamin D level within normal limits  Patient was given contact information for behavioral health clinic and was instructed to call 911 for emergencies.   Subjective:  Chief Complaint:  Chief Complaint  Patient presents with   Anxiety   Follow-up    Interval History: Things have been good since last visit. Can tell she has been coming off the elavil but nothing too bad. Initially more anxious during the day and night, not irritable. Stopped elavil a day or two and will start zoloft today. Reviewed medication plan.   Visit Diagnosis:    ICD-10-CM   1. Irritable bowel syndrome with both constipation and diarrhea  K58.2 sertraline (ZOLOFT) 50 MG tablet    2. Generalized anxiety disorder with panic attacks  F41.1 sertraline (ZOLOFT) 50 MG tablet   F41.0       Past Psychiatric History:  Diagnoses: GAD with panic attacks, IBS Medication trials: amitriptyline, buspar Previous psychiatrist/therapist: therapy in high school Hospitalizations: none Suicide attempts: none SIB: none Hx of violence towards others: none Current access to guns: yes, secured in gunsafe Hx of abuse: none Substance use: none  Past Medical History:  Past Medical History:  Diagnosis Date   Abdominal pain    Chronic diarrhea    GERD (gastroesophageal reflux disease)    with chronic belching   Headache    Varicella    PMH: x 2    Past Surgical History:  Procedure Laterality Date   COLONOSCOPY WITH PROPOFOL N/A 12/17/2016   Procedure: COLONOSCOPY WITH PROPOFOL;  Surgeon: Joycelyn Rua, MD;  Location: [place];  Service: Gastroenterology;  Laterality: N/A;   ESOPHAGOGASTRODUODENOSCOPY (EGD) WITH PROPOFOL N/A 12/17/2016   Procedure: ESOPHAGOGASTRODUODENOSCOPY (EGD) WITH PROPOFOL;  Surgeon: Joycelyn Rua, MD;  Location: [place];  Service: Gastroenterology;  Laterality: N/A;   WISDOM TOOTH EXTRACTION      Family Psychiatric History: as below  Family History:  Family History  Problem Relation Age of Onset   Hypertension Mother    Depression Father    Scoliosis Sister    ADD / ADHD Brother     Social History:  Social History   Socioeconomic History   Marital status: Single    Spouse name: Not on file   Number of children: Not on file   Years of education: Not on file   Highest education level: Not on file  Occupational History   Not on file  Tobacco Use   Smoking status: Passive Smoke Exposure - Never Smoker   Smokeless tobacco: Never   Tobacco comments:    father smokes in the home  Vaping Use   Vaping Use: Never used  Substance and Sexual Activity   Alcohol use: Yes    Comment: social 1-2 glasses of wine   Drug use: No    Comment: ask pt in private   Sexual activity: Not on file    Comment: ask pt in private; pt on birth control  Other Topics Concern   Not on file  Social History Narrative   Pt in 11th grade 2018-19 and does excellent in school. Lives at home with mother and brother with alternating weeks with father.   Social Determinants of Health   Financial Resource Strain: Not on file  Food Insecurity: Not on file  Transportation Needs: Not on file  Physical Activity: Not on file  Stress: Not on file  Social Connections: Not on file    Allergies: No Known Allergies  Current Medications: Current Outpatient Medications  Medication Sig Dispense Refill   busPIRone (BUSPAR) 15 MG tablet Take 1 tablet (15 mg total) by mouth 2 (two) times daily. 180 tablet 3   hyoscyamine (LEVSIN) 0.125 MG tablet Take by mouth.      Norethindrone Acetate-Ethinyl Estrad-FE (BLISOVI 24 FE) 1-20 MG-MCG(24) tablet Take 1 tablet by mouth daily. 84 tablet 4   pantoprazole (PROTONIX) 40 MG tablet Take 40 mg by mouth daily.  2   sertraline (ZOLOFT) 50 MG tablet Take half a tablet once daily for 1 week. Then increase to full tablet daily thereafter. 30 tablet 1   No current facility-administered medications for this visit.    ROS: Review of Systems  Skin:        Mild hair loss  Psychiatric/Behavioral:  Positive for decreased concentration. Negative for dysphoric mood, self-injury, sleep disturbance and suicidal ideas. The patient is nervous/anxious.     Objective:  Psychiatric Specialty Exam: There were no vitals taken for this visit.There is no height or weight on file to calculate BMI.  General Appearance: Casual, Fairly Groomed, and appears stated age  Eye Contact:  Good  Speech:  Clear and Coherent and Normal Rate  Volume:  Normal  Mood:   "A little more anxious"  Affect:  Appropriate, Congruent, and slightly anxious  Thought Content: Logical and Hallucinations: None   Suicidal Thoughts:  No  Homicidal Thoughts:  No  Thought Process:  Coherent, Goal Directed, and Linear  Orientation:  Full (Time, Place, and Person)    Memory:  Immediate;   Good  Judgment:  Good  Insight:  Good  Concentration:  Concentration: Fair  Recall:  Good  Fund of Knowledge: Good  Language: Good  Psychomotor Activity:  Normal  Akathisia:  No  AIMS (if indicated): not done  Assets:  Communication Skills Desire for Improvement Financial Resources/Insurance Housing Intimacy Leisure Time [place] Talents/Skills Transportation Vocational/Educational  ADL's:  Intact  Cognition: WNL  Sleep:  Good   PE: General: sits comfortably in view of camera; no acute distress  Pulm: no increased work of breathing on room air  MSK: all extremity movements appear intact  Neuro: no focal neurological  deficits observed  Gait & Station: unable to assess by video    Metabolic Disorder Labs: No results found for: "HGBA1C", "MPG" No results found for: "PROLACTIN" No results found for: "CHOL", "TRIG", "HDL", "CHOLHDL", "VLDL", "LDLCALC" Lab Results  Component Value Date   TSH 0.706 12/04/2021    Therapeutic Level Labs: No results found for: "LITHIUM" No results found for: "VALPROATE" No results found for: "CBMZ"  Screenings:  PHQ2-9    [place] Office Visit from 04/12/2022 in [place] Office Visit from 08/03/2020 in [place] Office Visit from 04/16/2019 in [place] Office Visit from 03/13/2018 in [place] Office Visit from 01/21/2017 in [place]  PHQ-2 Total Score 1 0 0 0 0  PHQ-9 Total Score -- -- 0 0 0      Flowsheet Row Office Visit from 04/12/2022 in [place] No Risk       Collaboration of Care: Collaboration of Care: Medication Management AEB as above  Patient/Guardian was advised Release of Information must be obtained prior to any record release in order to collaborate their care with an outside provider. Patient/Guardian was advised if they have not already done so to contact the registration department to sign all necessary forms in order for [place] to release information regarding their care.   Consent: Patient/Guardian gives verbal consent for treatment and assignment of benefits for services provided during this visit. Patient/Guardian expressed understanding and agreed to proceed.   Televisit via video: I connected with Mearl on 05/03/22 at 11:00 AM EST by a video enabled telemedicine application and verified that I am speaking with the correct person using two identifiers.  Location: Patient: home Provider: home office   I discussed the limitations  of evaluation and management by telemedicine and the availability of in person appointments. The patient expressed understanding and agreed to proceed.  I discussed the assessment and treatment plan with the patient. The patient was provided an opportunity to ask questions and all were answered. The patient agreed with the plan and demonstrated an understanding of the instructions.   The patient was advised to call back or seek an in-person evaluation if the symptoms worsen or if the condition fails to improve as anticipated.  I provided 15 minutes of non-face-to-face time during this encounter.  Jacquelynn Cree, MD 05/03/2022, 11:14 AM

## 2022-05-25 ENCOUNTER — Other Ambulatory Visit (HOSPITAL_COMMUNITY): Payer: Self-pay | Admitting: Psychiatry

## 2022-05-25 DIAGNOSIS — F411 Generalized anxiety disorder: Secondary | ICD-10-CM

## 2022-05-25 DIAGNOSIS — K582 Mixed irritable bowel syndrome: Secondary | ICD-10-CM

## 2022-06-02 ENCOUNTER — Other Ambulatory Visit: Payer: Self-pay | Admitting: Family Medicine

## 2022-06-02 DIAGNOSIS — F411 Generalized anxiety disorder: Secondary | ICD-10-CM

## 2022-06-03 ENCOUNTER — Encounter (HOSPITAL_COMMUNITY): Payer: Self-pay

## 2022-06-04 ENCOUNTER — Telehealth (HOSPITAL_COMMUNITY): Payer: 59 | Admitting: Psychiatry

## 2022-06-04 ENCOUNTER — Other Ambulatory Visit (HOSPITAL_COMMUNITY): Payer: Self-pay | Admitting: Psychiatry

## 2022-06-04 DIAGNOSIS — F410 Panic disorder [episodic paroxysmal anxiety] without agoraphobia: Secondary | ICD-10-CM

## 2022-06-04 MED ORDER — BUSPIRONE HCL 7.5 MG PO TABS: ORAL_TABLET | ORAL | 0 refills | Status: DC

## 2022-06-04 NOTE — Progress Notes (Signed)
Had to reschedule appointment. Will proceed with buspar taper in preparation for next appointment. 7.5mg  bid x1 week then 7.5mg  once daily the week after.

## 2022-06-05 ENCOUNTER — Telehealth (HOSPITAL_COMMUNITY): Payer: 59 | Admitting: Psychiatry

## 2022-06-10 ENCOUNTER — Encounter (HOSPITAL_COMMUNITY): Payer: Self-pay | Admitting: Psychiatry

## 2022-06-10 ENCOUNTER — Telehealth (INDEPENDENT_AMBULATORY_CARE_PROVIDER_SITE_OTHER): Payer: 59 | Admitting: Psychiatry

## 2022-06-10 DIAGNOSIS — F410 Panic disorder [episodic paroxysmal anxiety] without agoraphobia: Secondary | ICD-10-CM | POA: Diagnosis not present

## 2022-06-10 DIAGNOSIS — K582 Mixed irritable bowel syndrome: Secondary | ICD-10-CM | POA: Diagnosis not present

## 2022-06-10 DIAGNOSIS — F411 Generalized anxiety disorder: Secondary | ICD-10-CM

## 2022-06-10 MED ORDER — SERTRALINE HCL 50 MG PO TABS: 50.0000 mg | ORAL_TABLET | Freq: Every day | ORAL | 0 refills | Status: DC

## 2022-06-10 NOTE — Patient Instructions (Signed)
Once you get to the 1 pill of BuSpar daily take that for 1 week and then discontinue.  We will assess how the Zoloft does on its own in the coming weeks and plan on following up in 6 to 8 weeks.  As much as you can, try to get 8 hours of sleep per night and that should help with your mood as well.

## 2022-06-10 NOTE — Progress Notes (Signed)
[facility] MD Outpatient Progress Note  06/10/2022 11:31 AM Patricia Hurley  MRN:  [id number]  Assessment:  [name] presents for follow-up evaluation. Today, 06/10/22, patient reports improvement to her anxiety since starting Zoloft with no panic attacks.  Similarly, she has not noticed any worsening of her anxiety with tapering of BuSpar and has 1 week remaining before fully discontinuing.  She wants to assess how the Zoloft does in the absence of BuSpar before making any changes to it given her improvement.  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  Otherwise doing well with stable IBS.  Follow up in about 6-8 weeks.  For safety, patient's acute risk factors for suicide are: Anxiety disorder.  Her chronic risk factors for suicide are: Chronic mental illness.  Her protective factors are: Employment, going to nursing school, actively seeking and engaging with mental health care, supportive friends and family, no suicidal ideation, no history of suicidal ideation.  While future events cannot be fully predicted, she does not currently meet IVC criteria and can continue as an outpatient.  Identifying Information: Patricia Hurley is a 22 y.o. female with a history of GAD with panic attacks, IBS, and hair loss who is an established patient with [facility] participating in follow-up via video conferencing. Initial evaluation for anxiety and medication management on 04/12/22; please see that note for full case formulation.  Patient reports mood symptoms consistent with GAD and panic attacks only occur a few times per year. She has noted benefit with buspar use but waning effect at midday. 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 She has been on elavil for IBS for  years but has intermittent improvement with this. She was amenable to tapering off of this medication in preparation for adding a different standing medication for mood. In treating her anxiety would expect IBS to improve significantly. Tapered elavil with plan to switch to zoloft and ultimately likely come off buspar in the future. She is having a bit of hair loss so coordinated with PCP about getting a vitamin d level. She is also potentially family planning in the next year and zoloft would be the safest option for this in the future.    Plan:   # GAD with panic attacks Past medication trials: Elavil, BuSpar Status of problem: improving Interventions: -- continue zoloft 50 mg daily -- continue buspar 7.5mg  bid for now   # IBS: constipation/diarrhea Past medication trials: Elavil Status of problem: Chronic and stable Interventions: -- continue hyoscyamine 0.125mg  tablet daily prn    # Hair loss Past medication trials:  Status of problem: Chronic and stable Interventions: -- Vitamin D level within normal limits  Patient was given contact information for behavioral health clinic and was instructed to call 911 for emergencies.   Subjective:  Chief Complaint:  Chief Complaint  Patient presents with   Anxiety   Follow-up    Interval History: Things have been good since last visit. No issues with zoloft. On Wednesday will go down to 1 pill of buspar daily and will take for one week then stop. Hasn't noticed a difference to anxiety with decreasing the buspar. Anxiety is gradually getting better with being on the zoloft. No GI symptoms  or headaches. When first starting had trouble falling asleep but now sleeping anywhere from 6-7hrs. 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 Only occasional coffee and no soda. Tea is everyday, usually one per day before noon.   Visit Diagnosis:    ICD-10-CM   1. Irritable bowel syndrome with both constipation and diarrhea  K58.2 sertraline (ZOLOFT)  50 MG tablet    2. Generalized anxiety disorder with panic attacks  F41.1 sertraline (ZOLOFT) 50 MG tablet   F41.0       Past Psychiatric History:  Diagnoses: GAD with panic attacks, IBS Medication trials: amitriptyline, buspar Previous psychiatrist/therapist: therapy in high school Hospitalizations: none Suicide attempts: none SIB: none Hx of violence towards others: none Current access to guns: yes, secured in gunsafe Hx of abuse: none Substance use: none  Past Medical History:  Past Medical History:  Diagnosis Date   Abdominal pain    Chronic diarrhea    GERD (gastroesophageal reflux disease)    with chronic belching   Headache    Varicella    PMH: x 2    Past Surgical History:  Procedure Laterality Date   COLONOSCOPY WITH PROPOFOL N/A 12/17/2016   Procedure: COLONOSCOPY WITH PROPOFOL;  Surgeon: Joycelyn Rua, MD;  Location: [place];  Service: Gastroenterology;  Laterality: N/A;   ESOPHAGOGASTRODUODENOSCOPY (EGD) WITH PROPOFOL N/A 12/17/2016   Procedure: ESOPHAGOGASTRODUODENOSCOPY (EGD) WITH PROPOFOL;  Surgeon: Joycelyn Rua, MD;  Location: [place];  Service: Gastroenterology;  Laterality: N/A;   WISDOM TOOTH EXTRACTION      Family Psychiatric History: as below  Family History:  Family History  Problem Relation Age of Onset   Hypertension Mother    Depression Father    Scoliosis Sister    ADD / ADHD Brother     Social History:  Social History   Socioeconomic History   Marital status: Single    Spouse name: Not on file   Number of children: Not on file   Years of education: Not on file   Highest education level: Not on file  Occupational History   Not on file  Tobacco Use   Smoking status: Passive Smoke Exposure - Never Smoker   Smokeless tobacco: Never   Tobacco comments:    father smokes in the home  Vaping Use   Vaping Use: Never used  Substance and Sexual Activity   Alcohol use: Yes    Comment: social 1-2 glasses of wine   Drug use:  No    Comment: ask pt in private   Sexual activity: Not on file    Comment: ask pt in private; pt on birth control  Other Topics Concern   Not on file  Social History Narrative   Pt in 11th grade 2018-19 and does excellent in school. Lives at home with mother and brother with alternating weeks with father.   Social Determinants of Health   Financial Resource Strain: Not on file  Food Insecurity: Not on file  Transportation Needs: Not on file  Physical Activity: Not on file  Stress: Not on file  Social Connections: Not on file    Allergies: No Known Allergies  Current Medications: Current Outpatient Medications  Medication Sig Dispense Refill   busPIRone (BUSPAR) 7.5 MG tablet Take one tablet twice daily for one week. Then one tablet once daily the next week. 30 tablet 0   hyoscyamine (LEVSIN) 0.125 MG tablet Take by mouth.     Norethindrone Acetate-Ethinyl Estrad-FE (BLISOVI 24 FE) 1-20 MG-MCG(24) tablet Take 1 tablet by  mouth daily. 84 tablet 4   pantoprazole (PROTONIX) 40 MG tablet Take 40 mg by mouth daily.  2   sertraline (ZOLOFT) 50 MG tablet Take 1 tablet (50 mg total) by mouth daily. 90 tablet 0   No current facility-administered medications for this visit.    ROS: Review of Systems  Skin:        Mild hair loss  Psychiatric/Behavioral:  Positive for decreased concentration. Negative for dysphoric mood, self-injury, sleep disturbance and suicidal ideas. The patient is nervous/anxious.     Objective:  Psychiatric Specialty Exam: There were no vitals taken for this visit.There is no height or weight on file to calculate BMI.  General Appearance: Casual, Fairly Groomed, and appears stated age  Eye Contact:  Good  Speech:  Clear and Coherent and Normal Rate  Volume:  Normal  Mood:   "Doing good"  Affect:  Appropriate, Congruent, and slightly anxious, spontaneous smile and laugh  Thought Content: Logical and Hallucinations: None   Suicidal Thoughts:  No  Homicidal  Thoughts:  No  Thought Process:  Coherent, Goal Directed, and Linear  Orientation:  Full (Time, Place, and Person)    Memory:  Immediate;   Good  Judgment:  Good  Insight:  Good  Concentration:  Concentration: Fair  Recall:  Good  Fund of Knowledge: Good  Language: Good  Psychomotor Activity:  Normal  Akathisia:  No  AIMS (if indicated): not done  Assets:  Communication Skills Desire for Improvement Financial Resources/Insurance Housing Intimacy Leisure Time Physical Health Resilience Social Support Talents/Skills Transportation Vocational/Educational  ADL's:  Intact  Cognition: WNL  Sleep:  Good   PE: General: sits comfortably in view of camera; no acute distress  Pulm: no increased work of breathing on room air  MSK: all extremity movements appear intact  Neuro: no focal neurological deficits observed  Gait & Station: unable to assess by video    Metabolic Disorder Labs: No results found for: "HGBA1C", "MPG" No results found for: "PROLACTIN" No results found for: "CHOL", "TRIG", "HDL", "CHOLHDL", "VLDL", "LDLCALC" Lab Results  Component Value Date   TSH 0.706 12/04/2021    Therapeutic Level Labs: No results found for: "LITHIUM" No results found for: "VALPROATE" No results found for: "CBMZ"  Screenings:  PHQ2-9    Flowsheet Row Office Visit from 04/12/2022 in [facility] Office Visit from 08/03/2020 in [facility] Office Visit from 04/16/2019 in [facility] Office Visit from 03/13/2018 in [facility] Office Visit from 01/21/2017 in [facility]  PHQ-2 Total Score 1 0 0 0 0  PHQ-9 Total Score -- -- 0 0 0      Flowsheet Row Office Visit from 04/12/2022 in [facility]  C-SSRS RISK CATEGORY No Risk       Collaboration of Care:  Collaboration of Care: Medication Management AEB as above  Patient/Guardian was advised Release of Information must be obtained prior to any record release in order to collaborate their care with an outside provider. Patient/Guardian was advised if they have not already done so to contact the registration department to sign all necessary forms in order for [place] to release information regarding their care.   Consent: Patient/Guardian gives verbal consent for treatment and assignment of benefits for services provided during this visit. Patient/Guardian expressed understanding and agreed to proceed.   Televisit via video: I connected with Adelina Mings  on 06/10/22 at 11:15 AM EST by a video enabled telemedicine application and verified that I am speaking with the correct person using two identifiers.  Location: Patient: At work Provider: home office   I discussed the limitations of evaluation and management by telemedicine and the availability of in person appointments. The patient expressed understanding and agreed to proceed.  I discussed the assessment and treatment plan with the patient. The patient was provided an opportunity to ask questions and all were answered. The patient agreed with the plan and demonstrated an understanding of the instructions.   The patient was advised to call back or seek an in-person evaluation if the symptoms worsen or if the condition fails to improve as anticipated.  I provided 15 minutes of non-face-to-face time during this encounter.  Jacquelynn Cree, MD 06/10/2022, 11:31 AM

## 2022-07-23 ENCOUNTER — Telehealth (INDEPENDENT_AMBULATORY_CARE_PROVIDER_SITE_OTHER): Payer: 59 | Admitting: Psychiatry

## 2022-07-23 ENCOUNTER — Encounter (HOSPITAL_COMMUNITY): Payer: Self-pay | Admitting: Psychiatry

## 2022-07-23 DIAGNOSIS — K582 Mixed irritable bowel syndrome: Secondary | ICD-10-CM | POA: Diagnosis not present

## 2022-07-23 DIAGNOSIS — F411 Generalized anxiety disorder: Secondary | ICD-10-CM

## 2022-07-23 DIAGNOSIS — F410 Panic disorder [episodic paroxysmal anxiety] without agoraphobia: Secondary | ICD-10-CM | POA: Diagnosis not present

## 2022-07-23 MED ORDER — SERTRALINE HCL 50 MG PO TABS: 75.0000 mg | ORAL_TABLET | Freq: Every day | ORAL | 1 refills | Status: DC

## 2022-07-23 NOTE — Patient Instructions (Addendum)
We increased the Zoloft to 75 mg (1.5 tablets) once daily today.  This should help with some of the excesses of nervousness that you are experiencing but continue to use the breathing techniques and sensory techniques that we discussed today.  This will make it a little bit easier to identify the thoughts that may be contributing to how you are feeling.  Here's a list of common cognitive distortions that may be happening for you: [email or web address]

## 2022-07-23 NOTE — Progress Notes (Signed)
[facility] MD Outpatient Progress Note  07/23/2022 2:06 PM Patricia Hurley  MRN:  [id number]  Assessment:  [name] presents for follow-up evaluation. Today, 07/23/22, patient reports improvement to her anxiety since starting Zoloft with no panic attacks.  However has noticed increased sweating at night.  She was successfully able to come off of BuSpar.  Still noticing however sensation of feeling like she needs to throw up when going to a new place or going places by herself; did psychotherapy intervention with deep breathing and sensory technique with increase sensation of calm.  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 And she will try to get an extra hour or 2 of sleep which should help her mood.  Otherwise doing well with stable IBS.  As a running into an insurance issue where they have not been accepting diagnosis codes used for billing encouraged her to have insurance contact her office with coding that they do cover as they are listed as in network with our office.  Follow up in about 8 weeks.  For safety, patient's acute risk factors for suicide are: Anxiety disorder.  Her chronic risk factors for suicide are: Chronic mental illness.  Her protective factors are: Employment, going to nursing school, actively seeking and engaging with mental health care, supportive friends and family, no suicidal ideation, no history of suicidal ideation.  While future events cannot be fully predicted, she does not currently meet IVC criteria and can continue as an outpatient.  Identifying Information: Patricia Hurley is a 22 y.o. female with a history of GAD with panic attacks, IBS, and hair loss who is an established patient with [facility] participating in follow-up via video conferencing. Initial evaluation for anxiety and medication management on 04/12/22; please  see that note for full case formulation.  Patient reports mood symptoms consistent with GAD and panic attacks only occur a few times per year. She has noted benefit with buspar use but waning effect at midday. 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 She has been on elavil for IBS for years but has intermittent improvement with this. She was amenable to tapering off of this medication in preparation for adding a different standing medication for mood. In treating her anxiety would expect IBS to improve significantly. Tapered elavil with plan to switch to zoloft and ultimately likely come off buspar in the future. She is having a bit of hair loss so coordinated with PCP about getting a vitamin d level. She is also potentially family planning in the next year and zoloft would be the safest option for this in the future.    Plan:   # GAD with panic attacks Past medication trials: Elavil, BuSpar Status of problem: improving Interventions: -- titrate zoloft to 75 mg daily (i3/20/24)   # IBS: constipation/diarrhea Past medication trials: Elavil Status of problem: Chronic and stable Interventions: -- continue hyoscyamine 0.125mg  tablet daily prn    # Hair loss Past medication trials:  Status of problem: Chronic and stable Interventions: -- Vitamin D level within normal limits  Patient was given contact information for behavioral health clinic and was instructed to call 911 for emergencies.   Subjective:  Chief Complaint:  Chief Complaint  Patient presents with   Anxiety  Follow-up    Interval History: Things have been good since last visit. Nothing really changed since last visit. Has been thinking of increasing dose of zoloft as she feels sort of meh. Nursing school going good and while still stressful is doing well. Will still get very anxious going places by herself or going to new places by  herself. A sense of unease. Has had a huge increase of sweating at night. Hard to say what thoughts accompany sense of unease; fear of something happening or running into people she previously had issues with. Started after high school. Had a lot of events in her life that come back up. The sensation goes straight to her stomach with nausea. Stomach pain too. With zoloft this has improved. Still sleeping anywhere from 6-7hrs. Nursing school is the main limiting factor but will be graduating soon. Only occasional coffee and no soda. Tea is everyday, usually one per day before noon. Has been running into coverage with insurance.   Visit Diagnosis:    ICD-10-CM   1. Irritable bowel syndrome with both constipation and diarrhea  K58.2 sertraline (ZOLOFT) 50 MG tablet    2. Generalized anxiety disorder with panic attacks  F41.1 sertraline (ZOLOFT) 50 MG tablet   F41.0       Past Psychiatric History:  Diagnoses: GAD with panic attacks, IBS Medication trials: amitriptyline, buspar Previous psychiatrist/therapist: therapy in high school Hospitalizations: none Suicide attempts: none SIB: none Hx of violence towards others: none Current access to guns: yes, secured in gunsafe Hx of abuse: none Substance use: none  Past Medical History:  Past Medical History:  Diagnosis Date   Abdominal pain    Chronic diarrhea    GERD (gastroesophageal reflux disease)    with chronic belching   Headache    Varicella    PMH: x 2    Past Surgical History:  Procedure Laterality Date   COLONOSCOPY WITH PROPOFOL N/A 12/17/2016   Procedure: COLONOSCOPY WITH PROPOFOL;  Surgeon: Joycelyn Rua, MD;  Location: [facility];  Service: Gastroenterology;  Laterality: N/A;   ESOPHAGOGASTRODUODENOSCOPY (EGD) WITH PROPOFOL N/A 12/17/2016   Procedure: ESOPHAGOGASTRODUODENOSCOPY (EGD) WITH PROPOFOL;  Surgeon: Joycelyn Rua, MD;  Location: [facility];  Service: Gastroenterology;  Laterality: N/A;   WISDOM TOOTH EXTRACTION       Family Psychiatric History: as below  Family History:  Family History  Problem Relation Age of Onset   Hypertension Mother    Depression Father    Scoliosis Sister    ADD / ADHD Brother     Social History:  Social History   Socioeconomic History   Marital status: Single    Spouse name: Not on file   Number of children: Not on file   Years of education: Not on file   Highest education level: Not on file  Occupational History   Not on file  Tobacco Use   Smoking status: Passive Smoke Exposure - Never Smoker   Smokeless tobacco: Never   Tobacco comments:    father smokes in the home  Vaping Use   Vaping Use: Never used  Substance and Sexual Activity   Alcohol use: Yes    Comment: social 1-2 glasses of wine   Drug use: No    Comment: ask pt in private   Sexual activity: Not on file    Comment: ask pt in private; pt on birth control  Other Topics Concern   Not on file  Social History Narrative   Pt in 11th grade 2018-19 and  does excellent in school. Lives at home with mother and brother with alternating weeks with father.   Social Determinants of Health   Financial Resource Strain: Not on file  Food Insecurity: Not on file  Transportation Needs: Not on file  Physical Activity: Not on file  Stress: Not on file  Social Connections: Not on file    Allergies: No Known Allergies  Current Medications: Current Outpatient Medications  Medication Sig Dispense Refill   hyoscyamine (LEVSIN) 0.125 MG tablet Take by mouth.     Norethindrone Acetate-Ethinyl Estrad-FE (BLISOVI 24 FE) 1-20 MG-MCG(24) tablet Take 1 tablet by mouth daily. 84 tablet 4   pantoprazole (PROTONIX) 40 MG tablet Take 40 mg by mouth daily.  2   sertraline (ZOLOFT) 50 MG tablet Take 1.5 tablets (75 mg total) by mouth daily. 90 tablet 1   No current facility-administered medications for this visit.    ROS: Review of Systems  Skin:        Mild hair loss  Psychiatric/Behavioral:  Positive for  decreased concentration. Negative for dysphoric mood, self-injury, sleep disturbance and suicidal ideas. The patient is nervous/anxious.     Objective:  Psychiatric Specialty Exam: There were no vitals taken for this visit.There is no height or weight on file to calculate BMI.  General Appearance: Casual, Fairly Groomed, and appears stated age  Eye Contact:  Good  Speech:  Clear and Coherent and Normal Rate  Volume:  Normal  Mood:   "Doing good"  Affect:  Appropriate, Congruent, and slightly anxious, spontaneous smile and laugh  Thought Content: Logical and Hallucinations: None   Suicidal Thoughts:  No  Homicidal Thoughts:  No  Thought Process:  Coherent, Goal Directed, and Linear  Orientation:  Full (Time, Place, and Person)    Memory:  Immediate;   Good  Judgment:  Good  Insight:  Good  Concentration:  Concentration: Fair  Recall:  Good  Fund of Knowledge: Good  Language: Good  Psychomotor Activity:  Normal  Akathisia:  No  AIMS (if indicated): not done  Assets:  Communication Skills Desire for Improvement Financial Resources/Insurance Housing Intimacy Leisure Time [place] Talents/Skills Transportation Vocational/Educational  ADL's:  Intact  Cognition: WNL  Sleep:  Good   PE: General: sits comfortably in view of camera; no acute distress  Pulm: no increased work of breathing on room air  MSK: all extremity movements appear intact  Neuro: no focal neurological deficits observed  Gait & Station: unable to assess by video    Metabolic Disorder Labs: No results found for: "HGBA1C", "MPG" No results found for: "PROLACTIN" No results found for: "CHOL", "TRIG", "HDL", "CHOLHDL", "VLDL", "LDLCALC" Lab Results  Component Value Date   TSH 0.706 12/04/2021    Therapeutic Level Labs: No results found for: "LITHIUM" No results found for: "VALPROATE" No results found for: "CBMZ"  Screenings:  PHQ2-9    [facility]  Visit from 04/12/2022 in [facility] Visit from 08/03/2020 in [facility] Office Visit from 04/16/2019 in [facility] Office Visit from 03/13/2018 in [facility] Office Visit from 01/21/2017 in [facility]  PHQ-2 Total Score 1 0 0 0 0  PHQ-9 Total Score -- -- 0 0 0      Flowsheet Row Office Visit from 04/12/2022 in [facility] at [facility] No Risk  Collaboration of Care: Collaboration of Care: Medication Management AEB as above  Patient/Guardian was advised Release of Information must be obtained prior to any record release in order to collaborate their care with an outside provider. Patient/Guardian was advised if they have not already done so to contact the registration department to sign all necessary forms in order for [place] to release information regarding their care.   Consent: Patient/Guardian gives verbal consent for treatment and assignment of benefits for services provided during this visit. Patient/Guardian expressed understanding and agreed to proceed.   Televisit via video: I connected with [place] on 07/23/22 at  1:30 PM EDT by a video enabled telemedicine application and verified that I am speaking with the correct person using two identifiers.  Location: Patient: At work Provider: home office   I discussed the limitations of evaluation and management by telemedicine and the availability of in person appointments. The patient expressed understanding and agreed to proceed.  I discussed the assessment and treatment plan with the patient. The patient was provided an opportunity to ask questions and all were answered. The patient agreed with the plan and demonstrated an understanding of the instructions.   The patient was advised to call back  or seek an in-person evaluation if the symptoms worsen or if the condition fails to improve as anticipated.  I provided 25 minutes of non-face-to-face time during this encounter.  Jacquelynn Cree, MD 07/23/2022, 2:06 PM

## 2022-07-24 ENCOUNTER — Telehealth (HOSPITAL_COMMUNITY): Payer: 59 | Admitting: Psychiatry

## 2022-07-24 ENCOUNTER — Other Ambulatory Visit: Payer: Self-pay | Admitting: Family Medicine

## 2022-07-24 DIAGNOSIS — F411 Generalized anxiety disorder: Secondary | ICD-10-CM

## 2022-09-07 ENCOUNTER — Other Ambulatory Visit: Payer: Self-pay | Admitting: Family Medicine

## 2022-09-07 ENCOUNTER — Other Ambulatory Visit (HOSPITAL_COMMUNITY): Payer: Self-pay | Admitting: Psychiatry

## 2022-09-07 DIAGNOSIS — K582 Mixed irritable bowel syndrome: Secondary | ICD-10-CM

## 2022-09-07 DIAGNOSIS — F411 Generalized anxiety disorder: Secondary | ICD-10-CM

## 2022-09-07 DIAGNOSIS — F410 Panic disorder [episodic paroxysmal anxiety] without agoraphobia: Secondary | ICD-10-CM

## 2022-09-17 ENCOUNTER — Other Ambulatory Visit (HOSPITAL_COMMUNITY): Payer: Self-pay | Admitting: Psychiatry

## 2022-09-17 DIAGNOSIS — F411 Generalized anxiety disorder: Secondary | ICD-10-CM

## 2022-09-17 DIAGNOSIS — K582 Mixed irritable bowel syndrome: Secondary | ICD-10-CM

## 2022-09-23 ENCOUNTER — Encounter (HOSPITAL_COMMUNITY): Payer: Self-pay | Admitting: Psychiatry

## 2022-09-23 ENCOUNTER — Telehealth (INDEPENDENT_AMBULATORY_CARE_PROVIDER_SITE_OTHER): Payer: 59 | Admitting: Psychiatry

## 2022-09-23 DIAGNOSIS — F410 Panic disorder [episodic paroxysmal anxiety] without agoraphobia: Secondary | ICD-10-CM | POA: Diagnosis not present

## 2022-09-23 DIAGNOSIS — F411 Generalized anxiety disorder: Secondary | ICD-10-CM

## 2022-09-23 DIAGNOSIS — K582 Mixed irritable bowel syndrome: Secondary | ICD-10-CM | POA: Diagnosis not present

## 2022-09-23 MED ORDER — SERTRALINE HCL 50 MG PO TABS: 75.0000 mg | ORAL_TABLET | Freq: Every day | ORAL | 1 refills | Status: DC

## 2022-09-23 MED ORDER — PROPRANOLOL HCL 10 MG PO TABS: 10.0000 mg | ORAL_TABLET | Freq: Two times a day (BID) | ORAL | 2 refills | Status: DC | PRN

## 2022-09-23 NOTE — Progress Notes (Signed)
[facility] MD Outpatient Progress Note  09/23/2022 1:35 PM Patricia Hurley  MRN:  [id number]  Assessment:  Patricia Hurley presents for follow-up evaluation. Today, 09/23/22, patient reports improvement to her anxiety since starting Zoloft with no panic attacks.  The increased sweating at night has begun to lessen somewhat the longer she has been on Zoloft.  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  On a note she has her boarding exam in 1 week and started propranolol today to assist with any panic that comes up with trying to take the test.  In the absence of nursing school sleep actually improved as was suspected.  Otherwise doing well with stable IBS. Follow up in about 12 weeks.  For safety, patient's acute risk factors for suicide are: Anxiety disorder.  Her chronic risk factors for suicide are: Chronic mental illness.  Her protective factors are: Employment, going to nursing school, actively seeking and engaging with mental health care, supportive friends and family, no suicidal ideation, no history of suicidal ideation.  While future events cannot be fully predicted, she does not currently meet IVC criteria and can continue as an outpatient.  Identifying Information: Patricia Hurley is a 22 y.o. female with a history of GAD with panic attacks, IBS, and hair loss who is an established patient with [facility] participating in follow-up via video conferencing. Initial evaluation for anxiety and medication management on 04/12/22; please see that note for full case formulation.  Patient reports mood symptoms consistent with GAD and panic attacks only occur a few times per year. She has noted benefit with buspar use but waning effect at midday. 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 She has been on  elavil for IBS for years but has intermittent improvement with this. She was amenable to tapering off of this medication in preparation for adding a different standing medication for mood. In treating her anxiety would expect IBS to improve significantly. Tapered elavil with plan to switch to zoloft and ultimately likely come off buspar in the future. She is having a bit of hair loss so coordinated with PCP about getting a vitamin d level. She is also potentially family planning in the next year and zoloft would be the safest option for this in the future. She was successfully able to come off of BuSpar.  Still noticing however sensation of feeling like she needs to throw up when going to a new place or going places by herself; did psychotherapy intervention with deep breathing and sensory technique with increase sensation of calm.   Plan:   # GAD with panic attacks Past medication trials: Elavil, BuSpar Status of problem: Chronic with mild exacerbation Interventions: -- Continue zoloft 75 mg daily (i3/20/24) --Start propranolol 10 mg twice daily as needed for panic (s5/20/24)   # IBS: constipation/diarrhea Past medication trials: Elavil Status of problem: Improving Interventions: -- continue hyoscyamine 0.125mg  tablet daily prn    # Hair loss Past medication trials:  Status of problem: Chronic and stable Interventions: -- Vitamin D level within normal limits  Patient was given contact information for behavioral health clinic and was instructed to call 911 for emergencies.   Subjective:  Chief Complaint:  Chief Complaint  Patient presents with   Anxiety   Stress   Follow-up  Interval History: Things have been pretty good, did start the 75mg  of zoloft. Seems to be working well. Sweating is starting to get better, now about a couple times per week. 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 Feeling nervous for it but doesn't want to reschedule it.  Thinks she will be able push through it. Discussed propranolol and she would be amenable to trial of this to help with test anxiety. IBS is currently well managed and the sertaline seems to be helping. Sleep improved now that she is done with nursing school and can wake up earlier. Only occasional coffee and no soda. Tea is everyday, usually one per day before noon.    Visit Diagnosis:    ICD-10-CM   1. Generalized anxiety disorder with panic attacks  F41.1 sertraline (ZOLOFT) 50 MG tablet   F41.0 propranolol (INDERAL) 10 MG tablet    2. Irritable bowel syndrome with both constipation and diarrhea  K58.2 sertraline (ZOLOFT) 50 MG tablet      Past Psychiatric History:  Diagnoses: GAD with panic attacks, IBS Medication trials: amitriptyline, buspar, Zoloft (effective) Previous psychiatrist/therapist: therapy in high school Hospitalizations: none Suicide attempts: none SIB: none Hx of violence towards others: none Current access to guns: yes, secured in gunsafe Hx of abuse: none Substance use: none  Past Medical History:  Past Medical History:  Diagnosis Date   Abdominal pain    Chronic diarrhea    GERD (gastroesophageal reflux disease)    with chronic belching   Headache    Varicella    PMH: x 2    Past Surgical History:  Procedure Laterality Date   COLONOSCOPY WITH PROPOFOL N/A 12/17/2016   Procedure: COLONOSCOPY WITH PROPOFOL;  Surgeon: Adelene Amas, MD;  [facility] END[facility]COPY;  Service: Gastroenterology;  Laterality: N/A;   ESOPHAGOGASTRODUODENOSCOPY (EGD) WITH PROPOFOL N/A 12/17/2016   Procedure: ESOPHAGOGASTRODUODENOSCOPY (EGD) WITH PROPOFOL;  Surgeon: Adelene Amas, MD;  [facility]COPY;  Service: Gastroenterology;  Laterality: N/A;   WISDOM TOOTH EXTRACTION      Family Psychiatric History: as below  Family History:  Family History  Problem Relation Age of Onset   Hypertension Mother    Depression Father    Scoliosis Sister    ADD / ADHD Brother      Social History:  Social History   Socioeconomic History   Marital status: Single    Spouse name: Not on file   Number of children: Not on file   Years of education: Not on file   Highest education level: Not on file  Occupational History   Not on file  Tobacco Use   Smoking status: Passive Smoke Exposure - Never Smoker   Smokeless tobacco: Never   Tobacco comments:    father smokes in the home  Vaping Use   Vaping Use: Never used  Substance and Sexual Activity   Alcohol use: Yes    Comment: social 1-2 glasses of wine   Drug use: No    Comment: ask pt in private   Sexual activity: Not on file    Comment: ask pt in private; pt on birth control  Other Topics Concern   Not on file  Social History Narrative   Pt in 11th grade 2018-19 and does excellent in school. Lives at home with mother and brother with alternating weeks with father.   Social Determinants of Health   Financial Resource Strain: Not on file  Food Insecurity: Not on file  Transportation Needs:  Not on file  Physical Activity: Not on file  Stress: Not on file  Social Connections: Not on file    Allergies: No Known Allergies  Current Medications: Current Outpatient Medications  Medication Sig Dispense Refill   propranolol (INDERAL) 10 MG tablet Take 1 tablet (10 mg total) by mouth 2 (two) times daily as needed (panic). 30 tablet 2   hyoscyamine (LEVSIN) 0.125 MG tablet Take by mouth.     Norethindrone Acetate-Ethinyl Estrad-FE (BLISOVI 24 FE) 1-20 MG-MCG(24) tablet Take 1 tablet by mouth daily. 84 tablet 4   pantoprazole (PROTONIX) 40 MG tablet Take 40 mg by mouth daily.  2   sertraline (ZOLOFT) 50 MG tablet Take 1.5 tablets (75 mg total) by mouth daily. 135 tablet 1   No current facility-administered medications for this visit.    ROS: Review of Systems  Skin:        Mild hair loss  Psychiatric/Behavioral:  Positive for decreased concentration. Negative for dysphoric mood, self-injury, sleep  disturbance and suicidal ideas. The patient is nervous/anxious.     Objective:  Psychiatric Specialty Exam: There were no vitals taken for this visit.There is no height or weight on file to calculate BMI.  General Appearance: Casual, Fairly Groomed, and appears stated age  Eye Contact:  Good  Speech:  Clear and Coherent and Normal Rate  Volume:  Normal  Mood:   "Doing pretty good"  Affect:  Appropriate, Congruent, and slightly anxious, spontaneous smile and laugh.  Overall bright  Thought Content: Logical and Hallucinations: None   Suicidal Thoughts:  No  Homicidal Thoughts:  No  Thought Process:  Coherent, Goal Directed, and Linear  Orientation:  Full (Time, Place, and Person)    Memory:  Immediate;   Good  Judgment:  Good  Insight:  Good  Concentration:  Concentration: Fair  Recall:  Good  Fund of Knowledge: Good  Language: Good  Psychomotor Activity:  Normal  Akathisia:  No  AIMS (if indicated): not done  Assets:  Communication Skills Desire for Improvement Financial Resources/Insurance Housing Intimacy Leisure Time Physical Health Resilience Social Support Talents/Skills Transportation Vocational/Educational  ADL's:  Intact  Cognition: WNL  Sleep:  Good   PE: General: sits comfortably in view of camera; no acute distress  Pulm: no increased work of breathing on room air  MSK: all extremity movements appear intact  Neuro: no focal neurological deficits observed  Gait & Station: unable to assess by video    Metabolic Disorder Labs: No results found for: "HGBA1C", "MPG" No results found for: "PROLACTIN" No results found for: "CHOL", "TRIG", "HDL", "CHOLHDL", "VLDL", "LDLCALC" Lab Results  Component Value Date   TSH 0.706 12/04/2021    Therapeutic Level Labs: No results found for: "LITHIUM" No results found for: "VALPROATE" No results found for: "CBMZ"  Screenings:  PHQ2-9    Flowsheet Row Office Visit from 04/12/2022 in [facility] Office Visit from 08/03/2020 in [facility] Office Visit from 04/16/2019 in [facility] Office Visit from 03/13/2018 in [facility] Office Visit from 01/21/2017 in [facility]  PHQ-2 Total Score 1 0 0 0 0  PHQ-9 Total Score -- -- 0 0 0      Flowsheet Row Office Visit from 04/12/2022 in [facility] at [facility]  C-SSRS RISK CATEGORY No Risk       Collaboration of Care: Collaboration of Care: Medication  Management AEB as above  Patient/Guardian was advised Release of Information must be obtained prior to any record release in order to collaborate their care with an outside provider. Patient/Guardian was advised if they have not already done so to contact the registration department to sign all necessary forms in order for [place] to release information regarding their care.   Consent: Patient/Guardian gives verbal consent for treatment and assignment of benefits for services provided during this visit. Patient/Guardian expressed understanding and agreed to proceed.   Televisit via video: I connected with Patricia Hurley on 09/23/22 at  1:00 PM EDT by a video enabled telemedicine application and verified that I am speaking with the correct person using two identifiers.  Location: Patient: At home Provider: home office   I discussed the limitations of evaluation and management by telemedicine and the availability of in person appointments. The patient expressed understanding and agreed to proceed.  I discussed the assessment and treatment plan with the patient. The patient was provided an opportunity to ask questions and all were answered. The patient agreed with the plan and demonstrated an understanding of the instructions.   The patient was advised to call back or seek an in-person evaluation if the symptoms  worsen or if the condition fails to improve as anticipated.  I provided 20 minutes of non-face-to-face time during this encounter.  Elsie Lincoln, MD 09/23/2022, 1:35 PM

## 2022-09-23 NOTE — Patient Instructions (Signed)
We added propranolol 10 mg twice daily as needed for panic today.  Try taking this before your exam day comes to see how it affects you but it should not cause any sedation.  Otherwise we continued the Zoloft 75 mg (1.5 of the 50 mg tablets) daily.

## 2022-10-05 ENCOUNTER — Other Ambulatory Visit (HOSPITAL_COMMUNITY): Payer: Self-pay | Admitting: Psychiatry

## 2022-10-05 DIAGNOSIS — F410 Panic disorder [episodic paroxysmal anxiety] without agoraphobia: Secondary | ICD-10-CM

## 2022-12-16 ENCOUNTER — Telehealth (HOSPITAL_COMMUNITY): Payer: 59 | Admitting: Psychiatry

## 2022-12-18 ENCOUNTER — Other Ambulatory Visit: Payer: Self-pay | Admitting: Family Medicine

## 2022-12-18 ENCOUNTER — Telehealth: Payer: Self-pay | Admitting: Family Medicine

## 2022-12-18 ENCOUNTER — Encounter: Payer: Self-pay | Admitting: Family Medicine

## 2022-12-18 DIAGNOSIS — Z3041 Encounter for surveillance of contraceptive pills: Secondary | ICD-10-CM

## 2022-12-18 NOTE — Telephone Encounter (Signed)
[place] NTBS Last PE 12/04/21 NO RF sent to pharmacy

## 2022-12-18 NOTE — Telephone Encounter (Signed)
Getting married October and is wanting to come off of OCP. Wondering if she needs to take any vitamins or other meds since her hormones will be changing during that time.  Advised that she can just stop the medication and if she is wanting to conceive that she may go ahead and start taking a prenatal multivitamin.  Pt had no further concerns.

## 2022-12-18 NOTE — Telephone Encounter (Signed)
Patient made appt for Monday 8/19 to see PCP about birth control medication. Stated that she will be out on Saturday 8/17 and wants to know if that would be okay. She is thinking about stopping this medication so she wants to talk to nurse if possible. Please call back and advise.

## 2022-12-18 NOTE — Telephone Encounter (Signed)
Left message that pt needs to schedule appt and will mail letter

## 2022-12-23 ENCOUNTER — Ambulatory Visit: Payer: Self-pay | Admitting: Family Medicine

## 2022-12-24 ENCOUNTER — Telehealth (HOSPITAL_COMMUNITY): Payer: 59 | Admitting: Psychiatry

## 2022-12-31 ENCOUNTER — Encounter (HOSPITAL_COMMUNITY): Payer: Self-pay | Admitting: Psychiatry

## 2022-12-31 ENCOUNTER — Telehealth (INDEPENDENT_AMBULATORY_CARE_PROVIDER_SITE_OTHER): Payer: 59 | Admitting: Psychiatry

## 2022-12-31 DIAGNOSIS — F411 Generalized anxiety disorder: Secondary | ICD-10-CM | POA: Diagnosis not present

## 2022-12-31 DIAGNOSIS — K582 Mixed irritable bowel syndrome: Secondary | ICD-10-CM | POA: Diagnosis not present

## 2022-12-31 DIAGNOSIS — F410 Panic disorder [episodic paroxysmal anxiety] without agoraphobia: Secondary | ICD-10-CM

## 2022-12-31 MED ORDER — SERTRALINE HCL 50 MG PO TABS: 75.0000 mg | ORAL_TABLET | Freq: Every day | ORAL | 1 refills | Status: DC

## 2022-12-31 NOTE — Patient Instructions (Signed)
We did not make any medication changes today.  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  If things are still going this well at next appointment we may look to decrease the dose of the Zoloft.

## 2022-12-31 NOTE — Progress Notes (Signed)
BH MD Outpatient Progress Note  12/31/2022 1:21 PM Patricia Hurley  MRN:  [id number]  Assessment:  [name] presents for follow-up evaluation. 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  Similarly she did not require propranolol prior to her big nursing exam.  The increased sweating at night has continued to lessen the longer she has been on Zoloft.  She may look into psychotherapy now that she has finished nursing school.  Otherwise doing well with stable IBS.  If symptoms remain in remission we will likely look to decrease dose of Zoloft at next appointment.  Follow up in about 12 weeks.  For safety, patient's acute risk factors for suicide are: Anxiety disorder, NICU work.  Her chronic risk factors for suicide are: Chronic mental illness.  Her protective factors are: Employment, going to nursing school, actively seeking and engaging with mental health care, supportive friends and family, no suicidal ideation, no history of suicidal ideation.  While future events cannot be fully predicted, she does not currently meet IVC criteria and can continue as an outpatient.  Identifying Information: Patricia Hurley is a 22 y.o. female with a history of GAD with panic attacks, IBS, and hair loss who is an established patient with [facility] participating in follow-up via video conferencing. Initial evaluation for anxiety and medication management on 04/12/22; please see that note for full case formulation.  Patient reports mood symptoms consistent with GAD and panic attacks only occur a few times per year. She has noted benefit with buspar use but waning effect at midday. 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 She has been on elavil for IBS for years but has  intermittent improvement with this. She was amenable to tapering off of this medication in preparation for adding a different standing medication for mood. In treating her anxiety would expect IBS to improve significantly. Tapered elavil with plan to switch to zoloft and ultimately likely come off buspar in the future. She is having a bit of hair loss so coordinated with PCP about getting a vitamin d level. She is also potentially family planning in the next year and zoloft would be the safest option for this in the future. She was successfully able to come off of BuSpar.  Still noticing however sensation of feeling like she needs to throw up when going to a new place or going places by herself; did psychotherapy intervention with deep breathing and sensory technique with increase sensation of calm.   Plan:   # GAD with panic attacks Past medication trials: Elavil, BuSpar Status of problem: Improving Interventions: -- Continue zoloft 75 mg daily (i3/20/24)   # IBS: constipation/diarrhea Past medication trials: Elavil Status of problem: Improving Interventions: -- continue hyoscyamine 0.125mg  tablet daily prn    # Hair loss Past medication trials:  Status of problem: Chronic and stable Interventions: -- Vitamin D level within normal limits  Patient was given contact information for behavioral health clinic and was instructed to call 911 for emergencies.   Subjective:  Chief Complaint:  Chief Complaint  Patient presents with   Stress   Follow-up    Interval History: Things have been pretty good, no changes. 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 Didn't end up taking the propranolol and hasn't needed since. IBS is currently well managed and the sertaline seems to be helping; random flare ups do occur. Only occasional coffee and no soda. Tea is everyday, usually one per day  before noon.    Visit Diagnosis:    ICD-10-CM   1. Generalized anxiety disorder with panic attacks  F41.1 sertraline (ZOLOFT) 50 MG tablet   F41.0     2. Irritable bowel syndrome with both constipation and diarrhea  K58.2 sertraline (ZOLOFT) 50 MG tablet       Past Psychiatric History:  Diagnoses: GAD with panic attacks, IBS Medication trials: amitriptyline, buspar, Zoloft (effective) Previous psychiatrist/therapist: therapy in high school Hospitalizations: none Suicide attempts: none SIB: none Hx of violence towards others: none Current access to guns: yes, secured in gunsafe Hx of abuse: none Substance use: none  Past Medical History:  Past Medical History:  Diagnosis Date   Abdominal pain    Chronic diarrhea    GERD (gastroesophageal reflux disease)    with chronic belching   Headache    Varicella    PMH: x 2    Past Surgical History:  Procedure Laterality Date   COLONOSCOPY WITH PROPOFOL N/A 12/17/2016   Procedure: COLONOSCOPY WITH PROPOFOL;  Surgeon: Adelene Amas, MD;  Location: [facility] ENDOSCOPY;  Service: Gastroenterology;  Laterality: N/A;   ESOPHAGOGASTRODUODENOSCOPY (EGD) WITH PROPOFOL N/A 12/17/2016   Procedure: ESOPHAGOGASTRODUODENOSCOPY (EGD) WITH PROPOFOL;  Surgeon: Adelene Amas, MD;  Location: [facility] ENDOSCOPY;  Service: Gastroenterology;  Laterality: N/A;   WISDOM TOOTH EXTRACTION      Family Psychiatric History: as below  Family History:  Family History  Problem Relation Age of Onset   Hypertension Mother    Depression Father    Scoliosis Sister    ADD / ADHD Brother     Social History:  Social History   Socioeconomic History   Marital status: Single    Spouse name: Not on file   Number of children: Not on file   Years of education: Not on file   Highest education level: Not on file  Occupational History   Not on file  Tobacco Use   Smoking status: Passive Smoke Exposure - Never Smoker   Smokeless tobacco: Never   Tobacco comments:     father smokes in the home  Vaping Use   Vaping status: Never Used  Substance and Sexual Activity   Alcohol use: Yes    Comment: social 1-2 glasses of wine   Drug use: No    Comment: ask pt in private   Sexual activity: Not on file    Comment: ask pt in private; pt on birth control  Other Topics Concern   Not on file  Social History Narrative   Pt in 11th grade 2018-19 and does excellent in school. Lives at home with mother and brother with alternating weeks with father.   Social Determinants of Health   Financial Resource Strain: Not on file  Food Insecurity: No Food Insecurity (04/04/2021)   Received from [facility], [facility]   Hunger Vital Sign    Worried About Running Out of Food in the Last Year: Never true    Ran Out of Food in the Last Year: Never true  Transportation Needs: Not on file  Physical Activity: Not on file  Stress: Not on file  Social Connections: Socially Integrated (01/27/2022)   [facility], [facility]  Social Network    How would you rate your social network (family, work, friends)?: Good participation with social networks    Allergies: No Known Allergies  Current Medications: Current Outpatient Medications  Medication Sig Dispense Refill   hyoscyamine (LEVSIN) 0.125 MG tablet Take by mouth.     pantoprazole (PROTONIX) 40 MG tablet Take 40 mg by mouth daily.  2   sertraline (ZOLOFT) 50 MG tablet Take 1.5 tablets (75 mg total) by mouth daily. 135 tablet 1   No current facility-administered medications for this visit.    ROS: Review of Systems  Skin:        Mild hair loss  Psychiatric/Behavioral:  Negative for decreased concentration, dysphoric mood, self-injury, sleep disturbance and suicidal ideas. The patient is not nervous/anxious.     Objective:  Psychiatric Specialty Exam: There were no vitals taken for this visit.There is no height or weight on file to calculate BMI.  General Appearance: Casual, Fairly  Groomed, and appears stated age  Eye Contact:  Good  Speech:  Clear and Coherent and Normal Rate  Volume:  Normal  Mood:   "pretty good"  Affect:  Appropriate, Congruent, and euthymic, spontaneous smile and laugh.  Overall bright  Thought Content: Logical and Hallucinations: None   Suicidal Thoughts:  No  Homicidal Thoughts:  No  Thought Process:  Coherent, Goal Directed, and Linear  Orientation:  Full (Time, Place, and Person)    Memory:  Immediate;   Good  Judgment:  Good  Insight:  Good  Concentration:  Concentration: Fair  Recall:  Good  Fund of Knowledge: Good  Language: Good  Psychomotor Activity:  Normal  Akathisia:  No  AIMS (if indicated): not done  Assets:  Communication Skills Desire for Improvement Financial Resources/Insurance Housing Intimacy Leisure Time Physical Health Resilience Social Support Talents/Skills Transportation Vocational/Educational  ADL's:  Intact  Cognition: WNL  Sleep:  Good   PE: General: sits comfortably in view of camera; no acute distress  Pulm: no increased work of breathing on room air  MSK: all extremity movements appear intact  Neuro: no focal neurological deficits observed  Gait & Station: unable to assess by video    Metabolic Disorder Labs: No results found for: "HGBA1C", "MPG" No results found for: "PROLACTIN" No results found for: "CHOL", "TRIG", "HDL", "CHOLHDL", "VLDL", "LDLCALC" Lab Results  Component Value Date   TSH 0.706 12/04/2021    Therapeutic Level Labs: No results found for: "LITHIUM" No results found for: "VALPROATE" No results found for: "CBMZ"  Screenings:  PHQ2-9    Flowsheet Row Office Visit from 04/12/2022 in [facility] [place] Office Visit from 08/03/2020 in [facility] Office Visit from 04/16/2019 in [facility] Office Visit from 03/13/2018 in [facility]  Office Visit from 01/21/2017 in [facility]  PHQ-2 Total Score 1 0 0 0 0  PHQ-9 Total Score -- -- 0 0 0      Flowsheet Row Office Visit from 04/12/2022 in [facility] [place]  C-SSRS RISK CATEGORY No Risk       Collaboration of Care: Collaboration of Care: Medication Management AEB as above  Patient/Guardian was advised Release of Information must be obtained prior to any record release in order to collaborate their care with an outside provider. Patient/Guardian was advised if they have not already done so to contact the registration department to sign all necessary  forms in order for [place] to release information regarding their care.   Consent: Patient/Guardian gives verbal consent for treatment and assignment of benefits for services provided during this visit. Patient/Guardian expressed understanding and agreed to proceed.   Televisit via video: I connected with Jewels on 12/31/22 at  1:00 PM EDT by a video enabled telemedicine application and verified that I am speaking with the correct person using two identifiers.  Location: Patient: At home Provider: home office   I discussed the limitations of evaluation and management by telemedicine and the availability of in person appointments. The patient expressed understanding and agreed to proceed.  I discussed the assessment and treatment plan with the patient. The patient was provided an opportunity to ask questions and all were answered. The patient agreed with the plan and demonstrated an understanding of the instructions.   The patient was advised to call back or seek an in-person evaluation if the symptoms worsen or if the condition fails to improve as anticipated.  I provided 10 minutes of virtual face-to-face time during this encounter.  Elsie Lincoln, MD 12/31/2022, 1:21 PM

## 2023-05-29 ENCOUNTER — Telehealth (HOSPITAL_COMMUNITY): Payer: PRIVATE HEALTH INSURANCE | Admitting: Psychiatry

## 2023-05-29 ENCOUNTER — Encounter (HOSPITAL_COMMUNITY): Payer: Self-pay | Admitting: Psychiatry

## 2023-05-29 DIAGNOSIS — F410 Panic disorder [episodic paroxysmal anxiety] without agoraphobia: Secondary | ICD-10-CM | POA: Diagnosis not present

## 2023-05-29 DIAGNOSIS — K582 Mixed irritable bowel syndrome: Secondary | ICD-10-CM

## 2023-05-29 DIAGNOSIS — F411 Generalized anxiety disorder: Secondary | ICD-10-CM | POA: Diagnosis not present

## 2023-05-29 MED ORDER — SERTRALINE HCL 50 MG PO TABS: 75.0000 mg | ORAL_TABLET | Freq: Every day | ORAL | 1 refills | Status: DC

## 2023-05-29 NOTE — Progress Notes (Signed)
BH MD Outpatient Progress Note  05/29/2023 3:23 PM Patricia Hurley  MRN:  [id number]  Assessment:  Patricia Hurley presents for follow-up evaluation. Today, 05/29/23, patient reports some worsening GERD in the setting of increasing caffeine use and encouraged her to cut back and avoid [employer] given the higher caffeine content of their beverages.  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  May try to cut back her dose of Zoloft to 50mg  and consented for use in the event she gets pregnant which she and her husband are trying for.  Discussion included but not limited to risk for preterm delivery, low birth weight, increased fussiness or lethargy after delivery and secretion in the breast milk which could lead to lethargy or difficulty latching.  Still not required propranolol at this point as a sign of Zoloft efficacy.  IBS has been very well controlled with Zoloft and has not needed hyoscyamine in roughly 2 years.  Follow up in about 12 weeks.  For safety, patient's acute risk factors for suicide are: Anxiety disorder, NICU work.  Her chronic risk factors for suicide are: Chronic mental illness.  Her protective factors are: Employment, going to nursing school, actively seeking and engaging with mental health care, supportive friends and family, no suicidal ideation, no history of suicidal ideation.  While future events cannot be fully predicted, she does not currently meet IVC criteria and can continue as an outpatient.  Identifying Information: Patricia Hurley is a 23 y.o. female with a history of GAD with panic attacks, IBS, and hair loss who is an established patient with [facility] participating in follow-up via video conferencing. Initial evaluation for anxiety and medication management on 04/12/22; please see that note for full case formulation.    Plan:   # GAD with panic attacks Past medication trials:  Elavil, BuSpar Status of problem: Improving Interventions: -- Continue zoloft 75 mg daily (i3/20/24) --Consented for Zoloft use in the event of pregnancy on 05/29/2023   # IBS: constipation/diarrhea Past medication trials: Elavil Status of problem: Improving Interventions: -- continue Zoloft as above  Patient was given contact information for behavioral health clinic and was instructed to call 911 for emergencies.   Subjective:  Chief Complaint:  Chief Complaint  Patient presents with   Anxiety   Follow-up   Stress    Interval History: Things have been pretty good, staying busy with work. Got married in October. Life is overall going good outside of some ups and downs. Never needed the propranolol for any of the wedding or new job stuff. 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 Feels overall at a good spot with the amount of stress so holding off on decrease for now. IBS is currently well managed and the sertaline seems to be helping; random flare ups do occur but only once since getting on zoloft. Only worsening symptom is GERD but has increased coffee/caffeine with a large coffee. Tea is everyday, usually one per day before noon.    Visit Diagnosis:    ICD-10-CM   1. Irritable bowel syndrome with both constipation and diarrhea  K58.2 sertraline (ZOLOFT) 50 MG tablet    2. Generalized anxiety disorder with panic attacks  F41.1 sertraline (ZOLOFT) 50 MG tablet   F41.0         Past Psychiatric History:  Diagnoses: GAD with panic attacks, IBS Medication trials:  amitriptyline, buspar, Zoloft (effective) Previous psychiatrist/therapist: therapy in high school Hospitalizations: none Suicide attempts: none SIB: none Hx of violence towards others: none Current access to guns: yes, secured in gunsafe Hx of abuse: none Substance use: none  Past Medical History:  Past Medical History:  Diagnosis Date   Abdominal pain    Chronic  diarrhea    GERD (gastroesophageal reflux disease)    with chronic belching   Headache    Varicella    PMH: x 2    Past Surgical History:  Procedure Laterality Date   COLONOSCOPY WITH PROPOFOL N/A 12/17/2016   Procedure: COLONOSCOPY WITH PROPOFOL;  Surgeon: Adelene Amas, MD;  [facility] END[facility]COPY;  Service: Gastroenterology;  Laterality: N/A;   ESOPHAGOGASTRODUODENOSCOPY (EGD) WITH PROPOFOL N/A 12/17/2016   Procedure: ESOPHAGOGASTRODUODENOSCOPY (EGD) WITH PROPOFOL;  Surgeon: Adelene Amas, MD;  Location: [facility] ENDOSCOPY;  Service: Gastroenterology;  Laterality: N/A;   WISDOM TOOTH EXTRACTION      Family Psychiatric History: as below  Family History:  Family History  Problem Relation Age of Onset   Hypertension Mother    Depression Father    Scoliosis Sister    ADD / ADHD Brother     Social History:  Social History   Socioeconomic History   Marital status: Married    Spouse name: Not on file   Number of children: Not on file   Years of education: Not on file   Highest education level: Not on file  Occupational History   Not on file  Tobacco Use   Smoking status: Passive Smoke Exposure - Never Smoker   Smokeless tobacco: Never   Tobacco comments:    father smokes in the home  Vaping Use   Vaping status: Never Used  Substance and Sexual Activity   Alcohol use: Yes    Comment: social 1-2 glasses of wine   Drug use: No    Comment: ask pt in private   Sexual activity: Not on file    Comment: ask pt in private; pt on birth control  Other Topics Concern   Not on file  Social History Narrative   Pt in 11th grade 2018-19 and does excellent in school. Lives at home with mother and brother with alternating weeks with father.   Social Drivers of Corporate investment banker Strain: Not on file  Food Insecurity: No Food Insecurity (04/04/2021)   Received [facility], [facility]   Hunger Vital Sign    Worried About Running Out of Food in the Last Year: Never  true    Ran Out of Food in the Last Year: Never true  Transportation Needs: Not on file  Physical Activity: Not on file  Stress: Not on file  Social Connections: Unknown (02/05/2023)   Received [facility]   Social Network    Social Network: Not on file    Allergies: No Known Allergies  Current Medications: Current Outpatient Medications  Medication Sig Dispense Refill   Probiotic Product (PROBIOTIC DAILY PO) Take by mouth daily.     pantoprazole (PROTONIX) 40 MG tablet Take 40 mg by mouth daily.  2   sertraline (ZOLOFT) 50 MG tablet Take 1.5 tablets (75 mg total) by mouth daily. 135 tablet 1   No current facility-administered medications for this visit.    ROS: Review of Systems  Gastrointestinal:  Negative for constipation and diarrhea.       GERD  Skin:        Mild hair loss  Psychiatric/Behavioral:  Negative for decreased concentration, dysphoric mood, self-injury, sleep disturbance and suicidal ideas. The patient is not nervous/anxious.     Objective:  Psychiatric Specialty Exam: There were no vitals taken for this visit.There is no height or weight on file to calculate BMI.  General Appearance: Casual, Fairly Groomed, and appears stated age  Eye Contact:  Good  Speech:  Clear and Coherent and Normal Rate  Volume:  Normal  Mood:   "pretty good, staying busy"  Affect:  Appropriate, Congruent, and euthymic, spontaneous smile and laugh.  Overall bright  Thought Content: Logical and Hallucinations: None   Suicidal Thoughts:  No  Homicidal Thoughts:  No  Thought Process:  Coherent, Goal Directed, and Linear  Orientation:  Full (Time, Place, and Person)    Memory:  Immediate;   Good  Judgment:  Good  Insight:  Good  Concentration:  Concentration: Fair  Recall:  Good  Fund of Knowledge: Good  Language: Good  Psychomotor Activity:  Normal  Akathisia:  No  AIMS (if indicated): not done  Assets:  Communication Skills Desire for Improvement Financial  Resources/Insurance Housing Intimacy Leisure Time Physical Health Resilience Social Support Talents/Skills Transportation Vocational/Educational  ADL's:  Intact  Cognition: WNL  Sleep:  Good   PE: General: sits comfortably in view of camera; no acute distress  Pulm: no increased work of breathing on room air  MSK: all extremity movements appear intact  Neuro: no focal neurological deficits observed  Gait & Station: unable to assess by video    Metabolic Disorder Labs: No results found for: "HGBA1C", "MPG" No results found for: "PROLACTIN" No results found for: "CHOL", "TRIG", "HDL", "CHOLHDL", "VLDL", "LDLCALC" Lab Results  Component Value Date   TSH 0.706 12/04/2021    Therapeutic Level Labs: No results found for: "LITHIUM" No results found for: "VALPROATE" No results found for: "CBMZ"  Screenings:  PHQ2-9    Flowsheet Row Office Visit from 04/12/2022 in [facility] [place] Office Visit from 08/03/2020 in [facility] Office Visit from 04/16/2019 in [facility] Office Visit from 03/13/2018 in [facility] Office Visit from 01/21/2017 in [facility]  PHQ-2 Total Score 1 0 0 0 0  PHQ-9 Total Score -- -- 0 0 0      Flowsheet Row Office Visit from 04/12/2022 in [facility] [place]  C-SSRS RISK CATEGORY No Risk       Collaboration of Care: Collaboration of Care: Medication Management AEB as above  Patient/Guardian was advised Release of Information must be obtained prior to any record release in order to collaborate their care with an outside provider. Patient/Guardian was advised if they have not already done so to contact the registration department to sign all necessary forms in order for [place] to release information regarding their care.   Consent: Patient/Guardian  gives verbal consent for treatment and assignment of benefits for services provided during this visit. Patient/Guardian expressed understanding and agreed to proceed.   Televisit via video: I connected with Patricia Hurley on 05/29/23 at  3:00 PM EST by a video enabled telemedicine application and verified that I am speaking with the correct person using two identifiers.  Location: Patient: At home Provider: home office   I discussed the limitations of evaluation and management by telemedicine and the availability of in person appointments. The patient expressed understanding and agreed to proceed.  I discussed the assessment and  treatment plan with the patient. The patient was provided an opportunity to ask questions and all were answered. The patient agreed with the plan and demonstrated an understanding of the instructions.   The patient was advised to call back or seek an in-person evaluation if the symptoms worsen or if the condition fails to improve as anticipated.  I provided 20 minutes of virtual face-to-face time during this encounter.  Elsie Lincoln, MD 05/29/2023, 3:23 PM

## 2023-05-29 NOTE — Patient Instructions (Addendum)
We did not make any medication changes today.  If you want to try the 50 mg dose of Zoloft again feel free to do so just keep me in the loop.  As we discussed Zoloft is very safe to take in pregnancy and you can see more information on this here: [email or web address]

## 2023-09-15 ENCOUNTER — Encounter (HOSPITAL_COMMUNITY): Payer: Self-pay | Admitting: Psychiatry

## 2023-09-15 ENCOUNTER — Telehealth (INDEPENDENT_AMBULATORY_CARE_PROVIDER_SITE_OTHER): Payer: PRIVATE HEALTH INSURANCE | Admitting: Psychiatry

## 2023-09-15 DIAGNOSIS — F411 Generalized anxiety disorder: Secondary | ICD-10-CM | POA: Diagnosis not present

## 2023-09-15 DIAGNOSIS — K582 Mixed irritable bowel syndrome: Secondary | ICD-10-CM | POA: Diagnosis not present

## 2023-09-15 DIAGNOSIS — F410 Panic disorder [episodic paroxysmal anxiety] without agoraphobia: Secondary | ICD-10-CM

## 2023-09-15 MED ORDER — SERTRALINE HCL 50 MG PO TABS: 75.0000 mg | ORAL_TABLET | Freq: Every day | ORAL | 1 refills | Status: AC

## 2023-09-15 NOTE — Patient Instructions (Signed)
 We did not make any medication changes today.

## 2023-09-15 NOTE — Progress Notes (Signed)
 [facility] MD Outpatient Progress Note  09/15/2023 3:33 PM Patricia Hurley  MRN:  [id number]  Assessment:  Patricia Hurley presents for follow-up evaluation. Today, 09/15/23, patient reports slight worsening of anxiety in the setting of conception planning and spent significant time in session today discussing typical course of pregnancy according to data.  She did attempt to decrease Zoloft  back to 50 mg but due to work stress resumed 75 mg dose and is overall back to baseline with the exception of the above.  Has been able to cut back significantly on caffeine and is only using 1 cup of coffee that is to improve daily.  As previously discussed, Zoloft  in pregnancy use discussion included but not limited to risk for preterm delivery, low birth weight, increased fussiness or lethargy after delivery and secretion in the breast milk which could lead to lethargy or difficulty latching.  Still not required propranolol  at this point as a sign of Zoloft  efficacy.  IBS has been very well controlled with Zoloft  and has not needed hyoscyamine in roughly 2 years.  No follow-up planned due to provider transition.  For safety, patient's acute risk factors for suicide are: Anxiety disorder, NICU work.  Her chronic risk factors for suicide are: Chronic mental illness.  Her protective factors are: Employment, going to nursing school, actively seeking and engaging with mental health care, supportive friends and family, no suicidal ideation, no history of suicidal ideation.  While future events cannot be fully predicted, she does not currently meet IVC criteria and can continue as an outpatient.  Identifying Information: Patricia Hurley is a 23 y.o. female with a history of GAD with panic attacks, IBS, and hair loss who is an established patient with [facility] participating in follow-up via video conferencing. Initial evaluation for anxiety and medication management on 04/12/22; please see that note for  full case formulation. 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    Plan:   # GAD with panic attacks Past medication trials: Elavil , BuSpar  Status of problem: Chronic with mild exacerbation Interventions: -- Continue zoloft  75 mg daily (i3/20/24) --Consented for Zoloft  use in the event of pregnancy on 05/29/2023   # IBS: constipation/diarrhea Past medication trials: Elavil  Status of problem: Improving Interventions: -- continue Zoloft  as above  Patient was given contact information for behavioral health clinic and was instructed to call 911 for emergencies.   Subjective:  Chief Complaint:  Chief Complaint  Patient presents with   Anxiety   Irritable Bowel Syndrome   Follow-up   Stress    Interval History: Things have been good since last appointment. Did try to cut back on the zoloft  and went to the 50mg  and could tell a difference with the shift. Tried this for 2 months and resumed the 75mg  dose again for the last month or so. The only side effect with resuming was night sweats returning. Started to have more anxiety at work specifically when on the lower dose. IBS also began to act up more. Had cut back on the caffeine during that interval and currently only 1 cup of coffee with homebrew. Likes keeping on her toes in the NICU environment. Enjoying married life and still considering family planning.     Visit Diagnosis:    ICD-10-CM   1. Irritable bowel syndrome with both constipation and diarrhea  K58.2 sertraline  (ZOLOFT ) 50 MG tablet    2. Generalized anxiety disorder with panic  attacks  F41.1 sertraline  (ZOLOFT ) 50 MG tablet   F41.0          Past Psychiatric History:  Diagnoses: GAD with panic attacks, IBS Medication trials: amitriptyline , buspar , Zoloft  (effective) Previous psychiatrist/therapist: therapy in high school Hospitalizations: none Suicide attempts: none SIB: none Hx of violence towards others:  none Current access to guns: yes, secured in gunsafe Hx of abuse: none Substance use: none  Past Medical History:  Past Medical History:  Diagnosis Date   Abdominal pain    Chronic diarrhea    GERD (gastroesophageal reflux disease)    with chronic belching   Headache    Varicella    PMH: x 2    Past Surgical History:  Procedure Laterality Date   COLONOSCOPY WITH PROPOFOL  N/A 12/17/2016   Procedure: COLONOSCOPY WITH PROPOFOL ;  Surgeon: Calvert Caul, MD;  Location: [facility] ENDOSCOPY;  Service: Gastroenterology;  Laterality: N/A;   ESOPHAGOGASTRODUODENOSCOPY (EGD) WITH PROPOFOL  N/A 12/17/2016   Procedure: ESOPHAGOGASTRODUODENOSCOPY (EGD) WITH PROPOFOL ;  Surgeon: Calvert Caul, MD;  Location: [facility] ENDOSCOPY;  Service: Gastroenterology;  Laterality: N/A;   WISDOM TOOTH EXTRACTION      Family Psychiatric History: as below  Family History:  Family History  Problem Relation Age of Onset   Hypertension Mother    Depression Father    Scoliosis Sister    ADD / ADHD Brother     Social History:  Social History   Socioeconomic History   Marital status: Married    Spouse name: Not on file   Number of children: Not on file   Years of education: Not on file   Highest education level: Not on file  Occupational History   Not on file  Tobacco Use   Smoking status: Passive Smoke Exposure - Never Smoker   Smokeless tobacco: Never   Tobacco comments:    father smokes in the home  Vaping Use   Vaping status: Never Used  Substance and Sexual Activity   Alcohol use: Yes    Comment: social 1-2 glasses of wine   Drug use: No    Comment: ask pt in private   Sexual activity: Not on file    Comment: ask pt in private; pt on birth control  Other Topics Concern   Not on file  Social History Narrative   Pt in 11th grade 2018-19 and does excellent in school. Lives at home with mother and brother with alternating weeks with father.   Social Drivers of Corporate investment banker Strain: Not  on file  Food Insecurity: No Food Insecurity (04/04/2021)   Received from [facility]   Hunger Vital Sign    Worried About Running Out of Food in the Last Year: Never true    Ran Out of Food in the Last Year: Never true  Transportation Needs: Not on file  Physical Activity: Not on file  Stress: Not on file  Social Connections: Unknown (02/05/2023)   Received from [facility]   Social Network    Social Network: Not on file    Allergies: No Known Allergies  Current Medications: Current Outpatient Medications  Medication Sig Dispense Refill   pantoprazole (PROTONIX) 40 MG tablet Take 40 mg by mouth daily.  2   Probiotic Product (PROBIOTIC DAILY PO) Take by mouth daily.     sertraline  (ZOLOFT ) 50 MG tablet Take 1.5 tablets (75 mg total) by mouth daily. 135 tablet 1   No current facility-administered medications for this visit.    ROS: Review  of Systems  Gastrointestinal:  Negative for constipation and diarrhea.       GERD  Genitourinary:        Seeking pregnancy  Skin:        Mild hair loss  Psychiatric/Behavioral:  Negative for decreased concentration, dysphoric mood, self-injury, sleep disturbance and suicidal ideas. The patient is nervous/anxious.     Objective:  Psychiatric Specialty Exam: There were no vitals taken for this visit.There is no height or weight on file to calculate BMI.  General Appearance: Casual, Fairly Groomed, and appears stated age  Eye Contact:  Good  Speech:  Clear and Coherent and Normal Rate  Volume:  Normal  Mood:  "The not getting pregnant has been weighing on me"  Affect:  Appropriate, Congruent, and slightly anxious, spontaneous smile and laugh.  Overall bright  Thought Content: Logical and Hallucinations: None   Suicidal Thoughts:  No  Homicidal Thoughts:  No  Thought Process:  Coherent, Goal Directed, and Linear  Orientation:  Full (Time, Place, and Person)    Memory:  Immediate;   Good  Judgment:  Good  Insight:   Good  Concentration:  Concentration: Fair  Recall:  Good  Fund of Knowledge: Good  Language: Good  Psychomotor Activity:  Normal  Akathisia:  No  AIMS (if indicated): not done  Assets:  Communication Skills Desire for Improvement Financial Resources/Insurance Housing Intimacy Leisure Time Physical Health Resilience Social Support Talents/Skills Transportation Vocational/Educational  ADL's:  Intact  Cognition: WNL  Sleep:  Good   PE: General: sits comfortably in view of camera; no acute distress  Pulm: no increased work of breathing on room air  MSK: all extremity movements appear intact  Neuro: no focal neurological deficits observed  Gait & Station: unable to assess by video    Metabolic Disorder Labs: No results found for: "HGBA1C", "MPG" No results found for: "PROLACTIN" No results found for: "CHOL", "TRIG", "HDL", "CHOLHDL", "VLDL", "LDLCALC" Lab Results  Component Value Date   TSH 0.706 12/04/2021    Therapeutic Level Labs: No results found for: "LITHIUM" No results found for: "VALPROATE" No results found for: "CBMZ"  Screenings:  PHQ2-9    Flowsheet Row Office Visit from 04/12/2022 in [facility] [place] Office Visit from 08/03/2020 in [facility] Office Visit from 04/16/2019 in [facility] Office Visit from 03/13/2018 in [facility] Office Visit from 01/21/2017 in [facility]  PHQ-2 Total Score 1 0 0 0 0  PHQ-9 Total Score -- -- 0 0 0      Flowsheet Row Office Visit from 04/12/2022 in [facility]  C-SSRS RISK CATEGORY No Risk       Collaboration of Care: Collaboration of Care: Medication Management AEB as above  Patient/Guardian was advised Release of Information must be obtained prior to any record release in order to collaborate their  care with an outside provider. Patient/Guardian was advised if they have not already done so to contact the registration department to sign all necessary forms in order for us  to release information regarding their care.   Consent: Patient/Guardian gives verbal consent for treatment and assignment of benefits for services provided during this visit. Patient/Guardian expressed understanding and agreed to proceed.   Televisit via video: I connected with Aedyn on 09/15/23 at  3:00 PM EDT by a video enabled telemedicine application and verified that I am speaking with  the correct person using two identifiers.  Location: Patient: At home Provider: home office   I discussed the limitations of evaluation and management by telemedicine and the availability of in person appointments. The patient expressed understanding and agreed to proceed.  I discussed the assessment and treatment plan with the patient. The patient was provided an opportunity to ask questions and all were answered. The patient agreed with the plan and demonstrated an understanding of the instructions.   The patient was advised to call back or seek an in-person evaluation if the symptoms worsen or if the condition fails to improve as anticipated.  I provided 30 minutes of virtual face-to-face time during this encounter.  Madie Schilling, MD 09/15/2023, 3:33 PM
